# Patient Record
Sex: Female | Born: 1955
Health system: Southern US, Community
[De-identification: ages and names within clinical notes are randomized; demographics above are authoritative.]

## PROBLEM LIST (undated history)

## (undated) DIAGNOSIS — Z972 Presence of dental prosthetic device (complete) (partial): Secondary | ICD-10-CM

## (undated) DIAGNOSIS — K219 Gastro-esophageal reflux disease without esophagitis: Secondary | ICD-10-CM

## (undated) DIAGNOSIS — F419 Anxiety disorder, unspecified: Secondary | ICD-10-CM

## (undated) DIAGNOSIS — J449 Chronic obstructive pulmonary disease, unspecified: Secondary | ICD-10-CM

## (undated) DIAGNOSIS — D509 Iron deficiency anemia, unspecified: Secondary | ICD-10-CM

## (undated) DIAGNOSIS — C44300 Unspecified malignant neoplasm of skin of unspecified part of face: Secondary | ICD-10-CM

## (undated) DIAGNOSIS — E785 Hyperlipidemia, unspecified: Secondary | ICD-10-CM

## (undated) DIAGNOSIS — I10 Essential (primary) hypertension: Secondary | ICD-10-CM

## (undated) DIAGNOSIS — G4700 Insomnia, unspecified: Secondary | ICD-10-CM

## (undated) DIAGNOSIS — M1990 Unspecified osteoarthritis, unspecified site: Secondary | ICD-10-CM

## (undated) DIAGNOSIS — K08109 Complete loss of teeth, unspecified cause, unspecified class: Secondary | ICD-10-CM

## (undated) DIAGNOSIS — F17200 Nicotine dependence, unspecified, uncomplicated: Secondary | ICD-10-CM

## (undated) DIAGNOSIS — D649 Anemia, unspecified: Secondary | ICD-10-CM

## (undated) DIAGNOSIS — L409 Psoriasis, unspecified: Secondary | ICD-10-CM

## (undated) HISTORY — PX: EXPLORATORY LAPAROTOMY: SUR591

## (undated) HISTORY — DX: Anxiety disorder, unspecified: F41.9

## (undated) HISTORY — DX: Gastro-esophageal reflux disease without esophagitis: K21.9

## (undated) HISTORY — PX: APPENDECTOMY: SHX54

## (undated) HISTORY — DX: Insomnia, unspecified: G47.00

## (undated) HISTORY — DX: Unspecified malignant neoplasm of skin of unspecified part of face: C44.300

## (undated) HISTORY — DX: Iron deficiency anemia, unspecified: D50.9

## (undated) HISTORY — DX: Anemia, unspecified: D64.9

## (undated) HISTORY — DX: Hyperlipidemia, unspecified: E78.5

## (undated) HISTORY — DX: Essential (primary) hypertension: I10

## (undated) HISTORY — DX: Nicotine dependence, unspecified, uncomplicated: F17.200

## (undated) HISTORY — DX: Psoriasis, unspecified: L40.9

## (undated) HISTORY — PX: SKIN BIOPSY: SHX1

## (undated) HISTORY — PX: TUBAL LIGATION: SHX77

## (undated) HISTORY — DX: Unspecified osteoarthritis, unspecified site: M19.90

---

## 2005-09-09 ENCOUNTER — Emergency Department (HOSPITAL_COMMUNITY): Admission: EM | Admit: 2005-09-09 | Discharge: 2005-09-09 | Payer: Self-pay | Admitting: Emergency Medicine

## 2006-09-10 ENCOUNTER — Emergency Department (HOSPITAL_COMMUNITY): Admission: EM | Admit: 2006-09-10 | Discharge: 2006-09-10 | Payer: Self-pay | Admitting: Emergency Medicine

## 2013-09-06 ENCOUNTER — Encounter: Payer: Self-pay | Admitting: Family Medicine

## 2013-09-19 ENCOUNTER — Encounter: Payer: Self-pay | Admitting: Family Medicine

## 2013-09-19 ENCOUNTER — Ambulatory Visit (INDEPENDENT_AMBULATORY_CARE_PROVIDER_SITE_OTHER): Payer: BC Managed Care – PPO | Admitting: Family Medicine

## 2013-09-19 VITALS — BP 170/90 | HR 78 | Temp 97.7°F | Resp 16 | Ht 64.0 in | Wt 132.0 lb

## 2013-09-19 DIAGNOSIS — F17200 Nicotine dependence, unspecified, uncomplicated: Secondary | ICD-10-CM | POA: Insufficient documentation

## 2013-09-19 DIAGNOSIS — I10 Essential (primary) hypertension: Secondary | ICD-10-CM

## 2013-09-19 DIAGNOSIS — L4050 Arthropathic psoriasis, unspecified: Secondary | ICD-10-CM

## 2013-09-19 DIAGNOSIS — G4700 Insomnia, unspecified: Secondary | ICD-10-CM

## 2013-09-19 LAB — COMPLETE METABOLIC PANEL WITH GFR
ALK PHOS: 93 U/L (ref 39–117)
ALT: 17 U/L (ref 0–35)
AST: 19 U/L (ref 0–37)
Albumin: 4.3 g/dL (ref 3.5–5.2)
BUN: 7 mg/dL (ref 6–23)
CALCIUM: 9.8 mg/dL (ref 8.4–10.5)
CO2: 29 meq/L (ref 19–32)
Chloride: 101 mEq/L (ref 96–112)
Creat: 0.64 mg/dL (ref 0.50–1.10)
GLUCOSE: 89 mg/dL (ref 70–99)
POTASSIUM: 4.4 meq/L (ref 3.5–5.3)
Sodium: 138 mEq/L (ref 135–145)
TOTAL PROTEIN: 7 g/dL (ref 6.0–8.3)
Total Bilirubin: 0.4 mg/dL (ref 0.2–1.2)

## 2013-09-19 LAB — CBC WITH DIFFERENTIAL/PLATELET
Basophils Absolute: 0.1 10*3/uL (ref 0.0–0.1)
Basophils Relative: 1 % (ref 0–1)
Eosinophils Absolute: 0.1 10*3/uL (ref 0.0–0.7)
Eosinophils Relative: 2 % (ref 0–5)
HEMATOCRIT: 36.9 % (ref 36.0–46.0)
HEMOGLOBIN: 12.7 g/dL (ref 12.0–15.0)
LYMPHS ABS: 2.5 10*3/uL (ref 0.7–4.0)
LYMPHS PCT: 35 % (ref 12–46)
MCH: 29.6 pg (ref 26.0–34.0)
MCHC: 34.4 g/dL (ref 30.0–36.0)
MCV: 86 fL (ref 78.0–100.0)
Monocytes Absolute: 0.5 10*3/uL (ref 0.1–1.0)
Monocytes Relative: 7 % (ref 3–12)
Neutro Abs: 4 10*3/uL (ref 1.7–7.7)
Neutrophils Relative %: 55 % (ref 43–77)
Platelets: 317 10*3/uL (ref 150–400)
RBC: 4.29 MIL/uL (ref 3.87–5.11)
RDW: 14 % (ref 11.5–15.5)
WBC: 7.2 10*3/uL (ref 4.0–10.5)

## 2013-09-19 LAB — LIPID PANEL
CHOL/HDL RATIO: 5.4 ratio
Cholesterol: 199 mg/dL (ref 0–200)
HDL: 37 mg/dL — ABNORMAL LOW (ref >39)
LDL Cholesterol: 131 mg/dL — ABNORMAL HIGH (ref 0–99)
Triglycerides: 155 mg/dL — ABNORMAL HIGH (ref <150)
VLDL: 31 mg/dL (ref 0–40)

## 2013-09-19 MED ORDER — TRAZODONE HCL 50 MG PO TABS: 25.0000 mg | ORAL_TABLET | Freq: Every evening | ORAL | Status: DC | PRN

## 2013-09-19 MED ORDER — MELOXICAM 15 MG PO TABS: 15.0000 mg | ORAL_TABLET | Freq: Every day | ORAL | Status: DC

## 2013-09-19 MED ORDER — LOSARTAN POTASSIUM 50 MG PO TABS: 50.0000 mg | ORAL_TABLET | Freq: Every day | ORAL | Status: DC

## 2013-09-19 NOTE — Progress Notes (Signed)
Subjective:    Patient ID: Lauren Harrison, female    DOB: September 11, 1955, 58 y.o.   MRN: [id number]  HPI Patient is here today to establish care. She has no significant past medical history that she is aware of. However her blood pressure is extremely elevated today at 170/90 she denies any chest pain, shortness of breath, dyspnea on exertion. Unfortunately she does smoke one pack of cigarettes per day. She also complains of daily insomnia. She is unable to fall asleep at night. At that he gets one hour of an interrupted sleep. She states that she awakens  frequently throughout the night and has a difficult time falling back asleep.  Her biggest concern today is pain in both hands and both knees. She reports joint pain in all MCP joints and all PIP joints in both hands. She also complains of pain and stiffness in both knees. This is been going on for crashing her. She's tried naproxen with minimal relief. Note she has a psoriasis-like rash on the dorsums of both hands both elbows. She's had this for years. She's never had a mammogram. She's never had a colonoscopy Past Medical History  Diagnosis Date  . Anemia   . Anxiety   . Arthritis   . Hypertension   . Smoker   . Insomnia    Past Surgical History  Procedure Laterality Date  . Appendectomy     No current outpatient prescriptions on file prior to visit.   No current facility-administered medications on file prior to visit.   No Known Allergies History   Social History  . Marital Status: Married    Spouse Name: N/A    Number of Children: N/A  . Years of Education: N/A   Occupational History  . Not on file.   Social History Main Topics  . Smoking status: Current Every Day Smoker  . Smokeless tobacco: Not on file  . Alcohol Use: Yes     Comment: Occasional  . Drug Use: No  . Sexual Activity: Yes    Birth Control/ Protection: None   Other Topics Concern  . Not on file   Social History Narrative  . No narrative on file       Review of Systems  All other systems reviewed and are negative.      Objective:   Physical Exam  Vitals reviewed. Constitutional: She appears well-developed and well-nourished.  Neck: Neck supple. No JVD present. No thyromegaly present.  Cardiovascular: Normal rate, regular rhythm, normal heart sounds and intact distal pulses.   No murmur heard. Pulmonary/Chest: Effort normal and breath sounds normal. No respiratory distress. She has no wheezes. She has no rales.  Abdominal: Soft. Bowel sounds are normal. She exhibits no distension. There is no tenderness. There is no rebound and no guarding.  Lymphadenopathy:    She has no cervical adenopathy.  Skin: Rash noted. There is erythema.   3 mm black abnormal-appearing mole behind the right ear        Assessment & Plan:  Essential hypertension - Plan: COMPLETE METABOLIC PANEL WITH GFR, Lipid panel, CBC with Differential  Psoriasis with arthropathy - Plan: Ambulatory referral to Rheumatology  Insomnia  Patient's blood pressure significantly elevated. I instructed patient on losartan 50 mg by mouth daily. Recheck blood pressure in one month. When the patient returns in one month I would like to perform a biopsy of the abnormal-appearing mole behind her right ear. Patient agrees to this. She is admitted for colonoscopy and a  mammogram. She elected to discuss this further at her next office visit. I instructed patient on Mobic 15 mg by mouth daily for her arthritis. I am concerned patient may have psoriatic arthritis. Therefore consult rheumatology as I believe the patient may benefit from [facility] injections.  I instructed patient on trazodone 50 mg by mouth each bedtime for insomnia. I encouraged the patient to consider smoking cessation.

## 2013-10-16 ENCOUNTER — Other Ambulatory Visit: Payer: Self-pay | Admitting: Family Medicine

## 2013-10-20 ENCOUNTER — Ambulatory Visit (INDEPENDENT_AMBULATORY_CARE_PROVIDER_SITE_OTHER): Payer: BC Managed Care – PPO | Admitting: Family Medicine

## 2013-10-20 ENCOUNTER — Encounter: Payer: Self-pay | Admitting: Family Medicine

## 2013-10-20 VITALS — BP 144/82 | HR 78 | Temp 97.6°F | Resp 16 | Ht 64.17 in | Wt 136.0 lb

## 2013-10-20 DIAGNOSIS — Z23 Encounter for immunization: Secondary | ICD-10-CM

## 2013-10-20 DIAGNOSIS — D485 Neoplasm of uncertain behavior of skin: Secondary | ICD-10-CM

## 2013-10-20 DIAGNOSIS — Z0000 Encounter for general adult medical examination without abnormal findings: Secondary | ICD-10-CM

## 2013-10-20 DIAGNOSIS — I10 Essential (primary) hypertension: Secondary | ICD-10-CM

## 2013-10-20 MED ORDER — ALPRAZOLAM 0.5 MG PO TABS: 0.5000 mg | ORAL_TABLET | Freq: Every evening | ORAL | Status: DC | PRN

## 2013-10-20 MED ORDER — LOSARTAN POTASSIUM 100 MG PO TABS: 100.0000 mg | ORAL_TABLET | Freq: Every day | ORAL | Status: DC

## 2013-10-20 NOTE — Progress Notes (Signed)
Subjective:    Patient ID: Lauren Harrison, female    DOB: [birth year], 58 y.o.   MRN: [id number]  HPI  Please see the patient's last office visit.  Patient's blood pressure is much better today at 144/82. She has tried to decrease her smoking. Unfortunately trazodone is not helping her sleep. She will try something stronger. In the past he is using a next 1-2 times a week to help with insomnia. She is requesting that I prescribe that medication for her. She is also requesting a flu shot. She is overdue for colonoscopy and a mammogram and is asking to schedule those. I be glad to do so. She also has a black 3-4 mm spot behind her right ear which is concerning for possible atypical viral. She is here today for biopsy of that. Past Medical History  Diagnosis Date  . Anemia   . Anxiety   . Arthritis   . Hypertension   . Smoker   . Insomnia    Past Surgical History  Procedure Laterality Date  . Appendectomy     Current Outpatient Prescriptions on File Prior to Visit  Medication Sig Dispense Refill  . losartan (COZAAR) 50 MG tablet Take 1 tablet (50 mg total) by mouth daily.  30 tablet  3  . meloxicam (MOBIC) 15 MG tablet TAKE 1 TABLET (15 MG TOTAL) BY MOUTH DAILY.  30 tablet  5  . traZODone (DESYREL) 50 MG tablet Take 0.5-1 tablets (25-50 mg total) by mouth at bedtime as needed for sleep.  30 tablet  3  . Naproxen Sodium (ALEVE) 220 MG CAPS Take by mouth.       No current facility-administered medications on file prior to visit.   No Known Allergies History   Social History  . Marital Status: Married    Spouse Name: N/A    Number of Children: N/A  . Years of Education: N/A   Occupational History  . Not on file.   Social History Main Topics  . Smoking status: Current Every Day Smoker  . Smokeless tobacco: Not on file  . Alcohol Use: Yes     Comment: Occasional  . Drug Use: No  . Sexual Activity: Yes    Birth Control/ Protection: None   Other Topics Concern  . Not on file    Social History Narrative  . No narrative on file     Review of Systems  All other systems reviewed and are negative.      Objective:   Physical Exam  Vitals reviewed. Constitutional: She appears well-developed and well-nourished.  Cardiovascular: Normal rate, regular rhythm and normal heart sounds.  Exam reveals no gallop and no friction rub.   No murmur heard. Pulmonary/Chest: Effort normal and breath sounds normal. No respiratory distress. She has no wheezes. She has no rales. She exhibits no tenderness.  Abdominal: Soft. Bowel sounds are normal. She exhibits no distension and no mass. There is no tenderness. There is no rebound and no guarding.          Assessment & Plan:  Neoplasm of uncertain behavior of skin - Plan: Pathology  Essential hypertension - Plan: Flu Vaccine QUAD 36+ mos IM  Need for prophylactic vaccination and inoculation against influenza - Plan: Flu Vaccine QUAD 36+ mos IM  Routine general medical examination at a health care facility - Plan: MM Digital Screening, Ambulatory referral to Gastroenterology   Using sterile technique, I anesthetized the lesion behind her right ear with 0.1% lidocaine with epinephrine.  I then performed a shave biopsy and sent the lesion to pathology and labeled container. Hemostasis was achieved with Drysol and a Band-Aid. Her blood pressure is much improved. I will increase her losartan 100 mg by mouth daily. I again encouraged her to continue her smoking cessation. I will schedule patient for mammogram as well as a colonoscopy. Patient also received her flu shot today.

## 2013-10-23 ENCOUNTER — Other Ambulatory Visit: Payer: Self-pay | Admitting: Family Medicine

## 2013-10-23 DIAGNOSIS — Z1211 Encounter for screening for malignant neoplasm of colon: Secondary | ICD-10-CM

## 2013-10-24 LAB — PATHOLOGY

## 2013-11-08 ENCOUNTER — Ambulatory Visit
Admission: RE | Admit: 2013-11-08 | Discharge: 2013-11-08 | Disposition: A | Discharge status: Home / Self Care | Payer: BC Managed Care – PPO | Source: Ambulatory Visit | Attending: Family Medicine | Admitting: Family Medicine

## 2013-11-08 DIAGNOSIS — Z0000 Encounter for general adult medical examination without abnormal findings: Secondary | ICD-10-CM

## 2013-11-13 ENCOUNTER — Other Ambulatory Visit: Payer: Self-pay | Admitting: Family Medicine

## 2013-11-13 DIAGNOSIS — R928 Other abnormal and inconclusive findings on diagnostic imaging of breast: Secondary | ICD-10-CM

## 2013-11-21 ENCOUNTER — Ambulatory Visit (HOSPITAL_COMMUNITY)
Admission: RE | Admit: 2013-11-21 | Discharge: 2013-11-21 | Disposition: A | Discharge status: Home / Self Care | Payer: BC Managed Care – PPO | Source: Ambulatory Visit | Attending: Family Medicine | Admitting: Family Medicine

## 2013-11-21 ENCOUNTER — Other Ambulatory Visit: Payer: Self-pay | Admitting: Family Medicine

## 2013-11-21 DIAGNOSIS — R928 Other abnormal and inconclusive findings on diagnostic imaging of breast: Secondary | ICD-10-CM

## 2013-11-21 DIAGNOSIS — R921 Mammographic calcification found on diagnostic imaging of breast: Secondary | ICD-10-CM

## 2013-11-27 ENCOUNTER — Ambulatory Visit
Admission: RE | Admit: 2013-11-27 | Discharge: 2013-11-27 | Disposition: A | Discharge status: Home / Self Care | Payer: BC Managed Care – PPO | Source: Ambulatory Visit | Attending: Family Medicine | Admitting: Family Medicine

## 2013-11-27 DIAGNOSIS — R921 Mammographic calcification found on diagnostic imaging of breast: Secondary | ICD-10-CM

## 2013-11-28 ENCOUNTER — Telehealth: Payer: Self-pay | Admitting: Family Medicine

## 2013-11-28 NOTE — Telephone Encounter (Signed)
LMTRC

## 2013-11-28 NOTE — Telephone Encounter (Signed)
lmtrc

## 2013-11-28 NOTE — Telephone Encounter (Signed)
Lea from the breast center calling to give biopsy results and to ask where patient needs to go for surgery  [phone] asap she said

## 2013-12-12 ENCOUNTER — Other Ambulatory Visit: Payer: Self-pay | Admitting: Rheumatology

## 2013-12-12 ENCOUNTER — Ambulatory Visit
Admission: RE | Admit: 2013-12-12 | Discharge: 2013-12-12 | Disposition: A | Discharge status: Home / Self Care | Payer: BC Managed Care – PPO | Source: Ambulatory Visit | Attending: Rheumatology | Admitting: Rheumatology

## 2013-12-12 ENCOUNTER — Other Ambulatory Visit (INDEPENDENT_AMBULATORY_CARE_PROVIDER_SITE_OTHER): Payer: Self-pay | Admitting: General Surgery

## 2013-12-12 DIAGNOSIS — F17200 Nicotine dependence, unspecified, uncomplicated: Secondary | ICD-10-CM

## 2013-12-12 DIAGNOSIS — R928 Other abnormal and inconclusive findings on diagnostic imaging of breast: Secondary | ICD-10-CM

## 2013-12-25 ENCOUNTER — Other Ambulatory Visit: Payer: Self-pay | Admitting: Family Medicine

## 2013-12-25 NOTE — Telephone Encounter (Signed)
ok 

## 2013-12-25 NOTE — Telephone Encounter (Signed)
?   OK to Refill  

## 2013-12-26 NOTE — Telephone Encounter (Signed)
ok 

## 2014-01-01 ENCOUNTER — Encounter (HOSPITAL_BASED_OUTPATIENT_CLINIC_OR_DEPARTMENT_OTHER): Payer: Self-pay | Admitting: *Deleted

## 2014-01-01 NOTE — Progress Notes (Signed)
Will come in for labs and ekg after seeds 12/3

## 2014-01-03 NOTE — H&P (Signed)
Lauren Harrison  Location: [facility] Patient #: [id number] DOB: 06/09/1955 Married / Language: English / Race: White Female       History of Present Illness Patient words: eval lesion left breast.  The patient is a 58 year old female who presents with a complaint of Breast problems. This is a 58 year old female from [place] ,[place]. Her PCP is Dr. Jenna Luo. She is referred by Dr. Nolon Nations at the Breast Ctr[facility] for evaluation and management of left breast calcifications which have been biopsied and showed a complex sclerosing lesion. The patient had an open left breast biopsy in the upper outer quadrant many years ago. She states this was benign calcifications. She recently went for screening mammograms and they found a 2.6 cm x 1.6 cm area of suspicious amorphous calcifications, left breast, upper outer quadrant. Biopsy shows complex sclerosing lesion. Family history is negative for breast or ovarian cancer. Past history is significant for tobacco abuse, hypertension, appendectomy and anxiety and insomnia. Addendum Note([name]; 12/12/2013 5:15 PM) The patient wishes to proceed with left breast lumpectomy with radioactive seed localization. I have discussed the indications, details, techniques, and numerous risk of the surgery with her. She is aware of the risk of bleeding, infection, further surgery if cancer is found, cosmetic deformity, nerve damage with chronic pain or numbness. She understands all of these issues and all of her questions are answered. She agrees with this plan.    Other Problems Anxiety Disorder Back Pain High blood pressure  Past Surgical History  Appendectomy Breast Biopsy Left.  Diagnostic Studies History \ Colonoscopy never Mammogram within last year Pap Smear >5 years ago  Allergies Marjean Donna, CMA; 12/12/2013 1:57 PM) No Known Drug Allergies11/11/2013  Medication History (Sonya Bynum,  CMA; 12/12/2013 1:57 PM) Losartan Potassium (100MG  Tablet, Oral) Active. Losartan Potassium (50MG  Tablet, Oral) Active. Meloxicam (15MG  Tablet, Oral) Active. TraZODone HCl (50MG  Tablet, Oral) Active. ALPRAZolam (0.5MG  Tablet, Oral) Active.  Social History Alcohol use Occasional alcohol use. Caffeine use Carbonated beverages. No drug use Tobacco use Current some day smoker.  Family History Arthritis Family Members In General. Cancer Father. Cerebrovascular Accident Mother. Diabetes Mellitus Mother, Sister. Heart Disease Mother, Sister. Heart disease in female family member before age 75 Hypertension Family Members In General, Mother, Sister.  Pregnancy / Birth History  Age at menarche 61 years. Age of menopause <46 Gravida 1 Maternal age 23-25 Para 1  Review of Systems  General Not Present- Appetite Loss, Chills, Fatigue, Fever, Night Sweats, Weight Gain and Weight Loss. Skin Present- Dryness. Not Present- Change in Wart/Mole, Hives, Jaundice, New Lesions, Non-Healing Wounds, Rash and Ulcer. HEENT Not Present- Earache, Hearing Loss, Hoarseness, Nose Bleed, Oral Ulcers, Ringing in the Ears, Seasonal Allergies, Sinus Pain, Sore Throat, Visual Disturbances, Wears glasses/contact lenses and Yellow Eyes. Respiratory Not Present- Bloody sputum, Chronic Cough, Difficulty Breathing, Snoring and Wheezing. Breast Not Present- Breast Mass, Breast Pain, Nipple Discharge and Skin Changes. Cardiovascular Present- Leg Cramps. Not Present- Chest Pain, Difficulty Breathing Lying Down, Palpitations, Rapid Heart Rate, Shortness of Breath and Swelling of Extremities. Gastrointestinal Not Present- Abdominal Pain, Bloating, Bloody Stool, Change in Bowel Habits, Chronic diarrhea, Constipation, Difficulty Swallowing, Excessive gas, Gets full quickly at meals, Hemorrhoids, Indigestion, Nausea, Rectal Pain and Vomiting. Musculoskeletal Present- Back Pain and Joint Pain. Not Present-  Joint Stiffness, Muscle Pain, Muscle Weakness and Swelling of Extremities. Neurological Not Present- Decreased Memory, Fainting, Headaches, Numbness, Seizures, Tingling, Tremor, Trouble walking and Weakness. Psychiatric Present- Anxiety. Not Present-  Bipolar, Change in Sleep Pattern, Depression, Fearful and Frequent crying. Endocrine Not Present- Cold Intolerance, Excessive Hunger, Hair Changes, Heat Intolerance, Hot flashes and New Diabetes. Hematology Not Present- Easy Bruising, Excessive bleeding, Gland problems, HIV and Persistent Infections.   Vitals 12/12/2013 1:58 PM Weight: 137 lb Height: 64in Body Surface Area: 1.68 m Body Mass Index: 23.52 kg/m Temp.: 68F(Temporal)  Pulse: 78 (Regular)  BP: 126/72 (Sitting, Left Arm, Standard)    Physical Exam General Mental Status-Alert. General Appearance-Consistent with stated age. Hydration-Well hydrated. Voice-Normal.  Head and Neck Head-normocephalic, atraumatic with no lesions or palpable masses. Trachea-midline. Thyroid Gland Characteristics - normal size and consistency.  Eye Eyeball - Bilateral-Extraocular movements intact. Sclera/Conjunctiva - Bilateral-No scleral icterus.  Chest and Lung Exam Chest and lung exam reveals -quiet, even and easy respiratory effort with no use of accessory muscles and on auscultation, normal breath sounds, no adventitious sounds and normal vocal resonance. Inspection Chest Wall - Normal. Back - normal.  Breast Breast - Left-Symmetric and Biopsy scar, Non Tender, No Dimpling, No Inflammation, No Lumpectomy scars, No Mastectomy scars, No Peau d' Orange. Breast - Right-Symmetric, Non Tender, No Biopsy scars, no Dimpling, No Inflammation, No Lumpectomy scars, No Mastectomy scars, No Peau d' Orange. Breast Lump-No Palpable Breast Mass. Note: Left breast reveals well-healed linear scar upper outer quadrant. Recent biopsy site. No real palpable mass in  either breast. Minimal ecchymoses. No axillary masses on either side.   Cardiovascular Cardiovascular examination reveals -normal heart sounds, regular rate and rhythm with no murmurs and normal pedal pulses bilaterally.  Abdomen Inspection Inspection of the abdomen reveals - No Hernias. Palpation/Percussion Palpation and Percussion of the abdomen reveal - Soft, Non Tender, No Rebound tenderness, No Rigidity (guarding) and No hepatosplenomegaly. Auscultation Auscultation of the abdomen reveals - Bowel sounds normal. Note: Appendectomy scar.   Neurologic Neurologic evaluation reveals -alert and oriented x 3 with no impairment of recent or remote memory. Mental Status-Normal.  Musculoskeletal Normal Exam - Left-Upper Extremity Strength Normal and Lower Extremity Strength Normal. Normal Exam - Right-Upper Extremity Strength Normal and Lower Extremity Strength Normal.  Lymphatic Head & Neck  General Head & Neck Lymphatics: Bilateral - Description - Normal. Axillary  General Axillary Region: Bilateral - Description - Normal. Tenderness - Non Tender. Femoral & Inguinal  Generalized Femoral & Inguinal Lymphatics: Bilateral - Description - Normal. Tenderness - Non Tender.    Assessment & Plan  ABNORMAL MAMMOGRAM OF LEFT BREAST (793.80  R92.8) Current Plans  Schedule for Surgery The biopsy of the calcifications in your left breast show a complex sclerosing lesion. There is about a 10% chance that there could be early breast cancer in this area. This area of calcification should be excised, and we will schedule that surgery for you We have discussed the techniques and risks of the surgery. you will be scheduled for left breast lumpectomy with radioactive seed localization in the near future  TOBACCO ABUSE (305.1  Z72.0)  HYPERTENSION, BENIGN (401.1  I10)  ANXIETY (300.00  F41.9)    Kandance Yano M. Dalbert Batman, M.D., [facility] General and  Minimally invasive Surgery Breast and Colorectal Surgery Office:   [phone] Pager:   [phone]

## 2014-01-04 ENCOUNTER — Encounter (HOSPITAL_BASED_OUTPATIENT_CLINIC_OR_DEPARTMENT_OTHER)
Admission: RE | Admit: 2014-01-04 | Discharge: 2014-01-04 | Disposition: A | Discharge status: Home / Self Care | Payer: BC Managed Care – PPO | Source: Ambulatory Visit | Attending: General Surgery | Admitting: General Surgery

## 2014-01-04 ENCOUNTER — Ambulatory Visit
Admission: RE | Admit: 2014-01-04 | Discharge: 2014-01-04 | Disposition: A | Discharge status: Home / Self Care | Payer: BC Managed Care – PPO | Source: Ambulatory Visit | Attending: General Surgery | Admitting: General Surgery

## 2014-01-04 DIAGNOSIS — F419 Anxiety disorder, unspecified: Secondary | ICD-10-CM | POA: Diagnosis not present

## 2014-01-04 DIAGNOSIS — F17210 Nicotine dependence, cigarettes, uncomplicated: Secondary | ICD-10-CM | POA: Diagnosis not present

## 2014-01-04 DIAGNOSIS — R928 Other abnormal and inconclusive findings on diagnostic imaging of breast: Secondary | ICD-10-CM | POA: Diagnosis present

## 2014-01-04 DIAGNOSIS — I10 Essential (primary) hypertension: Secondary | ICD-10-CM | POA: Diagnosis not present

## 2014-01-04 DIAGNOSIS — N6092 Unspecified benign mammary dysplasia of left breast: Secondary | ICD-10-CM | POA: Diagnosis not present

## 2014-01-04 LAB — COMPREHENSIVE METABOLIC PANEL
ALT: 38 U/L — AB (ref 0–35)
AST: 34 U/L (ref 0–37)
Albumin: 3.7 g/dL (ref 3.5–5.2)
Alkaline Phosphatase: 114 U/L (ref 39–117)
Anion gap: 12 (ref 5–15)
BUN: 8 mg/dL (ref 6–23)
CALCIUM: 9.7 mg/dL (ref 8.4–10.5)
CO2: 26 mEq/L (ref 19–32)
CREATININE: 0.68 mg/dL (ref 0.50–1.10)
Chloride: 101 mEq/L (ref 96–112)
GFR calc Af Amer: >90 mL/min (ref >90)
GFR calc non Af Amer: >90 mL/min (ref >90)
Glucose, Bld: 96 mg/dL (ref 70–99)
Potassium: 5 mEq/L (ref 3.7–5.3)
SODIUM: 139 meq/L (ref 137–147)
TOTAL PROTEIN: 7 g/dL (ref 6.0–8.3)
Total Bilirubin: 0.3 mg/dL (ref 0.3–1.2)

## 2014-01-04 LAB — CBC WITH DIFFERENTIAL/PLATELET
BASOS PCT: 1 % (ref 0–1)
Basophils Absolute: 0 10*3/uL (ref 0.0–0.1)
EOS ABS: 0.1 10*3/uL (ref 0.0–0.7)
Eosinophils Relative: 2 % (ref 0–5)
HCT: 36.5 % (ref 36.0–46.0)
Hemoglobin: 11.7 g/dL — ABNORMAL LOW (ref 12.0–15.0)
Lymphocytes Relative: 34 % (ref 12–46)
Lymphs Abs: 1.9 10*3/uL (ref 0.7–4.0)
MCH: 28.8 pg (ref 26.0–34.0)
MCHC: 32.1 g/dL (ref 30.0–36.0)
MCV: 89.9 fL (ref 78.0–100.0)
Monocytes Absolute: 0.4 10*3/uL (ref 0.1–1.0)
Monocytes Relative: 7 % (ref 3–12)
Neutro Abs: 3.1 10*3/uL (ref 1.7–7.7)
Neutrophils Relative %: 56 % (ref 43–77)
PLATELETS: 299 10*3/uL (ref 150–400)
RBC: 4.06 MIL/uL (ref 3.87–5.11)
RDW: 13.3 % (ref 11.5–15.5)
WBC: 5.4 10*3/uL (ref 4.0–10.5)

## 2014-01-05 ENCOUNTER — Ambulatory Visit (HOSPITAL_BASED_OUTPATIENT_CLINIC_OR_DEPARTMENT_OTHER): Payer: BC Managed Care – PPO | Admitting: Anesthesiology

## 2014-01-05 ENCOUNTER — Ambulatory Visit
Admission: RE | Admit: 2014-01-05 | Discharge: 2014-01-05 | Disposition: A | Discharge status: Home / Self Care | Payer: BC Managed Care – PPO | Source: Ambulatory Visit | Attending: General Surgery | Admitting: General Surgery

## 2014-01-05 ENCOUNTER — Encounter (HOSPITAL_BASED_OUTPATIENT_CLINIC_OR_DEPARTMENT_OTHER): Payer: Self-pay | Admitting: *Deleted

## 2014-01-05 ENCOUNTER — Encounter (HOSPITAL_BASED_OUTPATIENT_CLINIC_OR_DEPARTMENT_OTHER)
Admission: RE | Disposition: A | Discharge status: Home / Self Care | Payer: Self-pay | Source: Ambulatory Visit | Attending: General Surgery

## 2014-01-05 ENCOUNTER — Ambulatory Visit (HOSPITAL_BASED_OUTPATIENT_CLINIC_OR_DEPARTMENT_OTHER)
Admission: RE | Admit: 2014-01-05 | Discharge: 2014-01-05 | Disposition: A | Discharge status: Home / Self Care | Payer: BC Managed Care – PPO | Source: Ambulatory Visit | Attending: General Surgery | Admitting: General Surgery

## 2014-01-05 DIAGNOSIS — R928 Other abnormal and inconclusive findings on diagnostic imaging of breast: Secondary | ICD-10-CM

## 2014-01-05 DIAGNOSIS — F17210 Nicotine dependence, cigarettes, uncomplicated: Secondary | ICD-10-CM | POA: Insufficient documentation

## 2014-01-05 DIAGNOSIS — N6092 Unspecified benign mammary dysplasia of left breast: Secondary | ICD-10-CM | POA: Insufficient documentation

## 2014-01-05 DIAGNOSIS — F419 Anxiety disorder, unspecified: Secondary | ICD-10-CM | POA: Insufficient documentation

## 2014-01-05 DIAGNOSIS — I10 Essential (primary) hypertension: Secondary | ICD-10-CM | POA: Insufficient documentation

## 2014-01-05 HISTORY — DX: Complete loss of teeth, unspecified cause, unspecified class: K08.109

## 2014-01-05 HISTORY — DX: Presence of dental prosthetic device (complete) (partial): K08.109

## 2014-01-05 HISTORY — PX: BREAST LUMPECTOMY WITH RADIOACTIVE SEED LOCALIZATION: SHX6424

## 2014-01-05 HISTORY — DX: Presence of dental prosthetic device (complete) (partial): Z97.2

## 2014-01-05 SURGERY — BREAST LUMPECTOMY WITH RADIOACTIVE SEED LOCALIZATION
Anesthesia: General | Site: Breast | Laterality: Left

## 2014-01-05 MED ORDER — BUPIVACAINE-EPINEPHRINE (PF) 0.5% -1:200000 IJ SOLN
INTRAMUSCULAR | Status: AC
  Filled 2014-01-05: qty 30

## 2014-01-05 MED ORDER — MIDAZOLAM HCL 2 MG/2ML IJ SOLN: 1.0000 mg | INTRAMUSCULAR | Status: DC | PRN

## 2014-01-05 MED ORDER — MIDAZOLAM HCL 2 MG/2ML IJ SOLN
INTRAMUSCULAR | Status: AC
  Filled 2014-01-05: qty 2

## 2014-01-05 MED ORDER — LIDOCAINE HCL (CARDIAC) 20 MG/ML IV SOLN
INTRAVENOUS | Status: DC | PRN
  Administered 2014-01-05: 80 mg via INTRAVENOUS

## 2014-01-05 MED ORDER — OXYCODONE HCL 5 MG PO TABS
ORAL_TABLET | ORAL | Status: AC
  Filled 2014-01-05: qty 1

## 2014-01-05 MED ORDER — OXYCODONE HCL 5 MG/5ML PO SOLN: 5.0000 mg | Freq: Once | ORAL | Status: AC | PRN

## 2014-01-05 MED ORDER — DEXAMETHASONE SODIUM PHOSPHATE 4 MG/ML IJ SOLN
INTRAMUSCULAR | Status: DC | PRN
  Administered 2014-01-05: 10 mg via INTRAVENOUS

## 2014-01-05 MED ORDER — PROPOFOL 10 MG/ML IV BOLUS
INTRAVENOUS | Status: DC | PRN
  Administered 2014-01-05: 160 mg via INTRAVENOUS

## 2014-01-05 MED ORDER — BUPIVACAINE-EPINEPHRINE (PF) 0.5% -1:200000 IJ SOLN
INTRAMUSCULAR | Status: DC | PRN
  Administered 2014-01-05: 10 mL

## 2014-01-05 MED ORDER — LACTATED RINGERS IV SOLN
INTRAVENOUS | Status: DC
  Administered 2014-01-05: 07:00:00 via INTRAVENOUS

## 2014-01-05 MED ORDER — CEFAZOLIN SODIUM-DEXTROSE 2-3 GM-% IV SOLR
2.0000 g | INTRAVENOUS | Status: AC
  Administered 2014-01-05: 2 g via INTRAVENOUS

## 2014-01-05 MED ORDER — ONDANSETRON HCL 4 MG/2ML IJ SOLN
INTRAMUSCULAR | Status: DC | PRN
  Administered 2014-01-05: 4 mg via INTRAVENOUS

## 2014-01-05 MED ORDER — HYDROMORPHONE HCL 1 MG/ML IJ SOLN: 0.2500 mg | INTRAMUSCULAR | Status: DC | PRN

## 2014-01-05 MED ORDER — FENTANYL CITRATE 0.05 MG/ML IJ SOLN
INTRAMUSCULAR | Status: AC
  Filled 2014-01-05: qty 6

## 2014-01-05 MED ORDER — FENTANYL CITRATE 0.05 MG/ML IJ SOLN
INTRAMUSCULAR | Status: DC | PRN
  Administered 2014-01-05: 100 ug via INTRAVENOUS

## 2014-01-05 MED ORDER — OXYCODONE HCL 5 MG PO TABS
5.0000 mg | ORAL_TABLET | Freq: Once | ORAL | Status: AC | PRN
  Administered 2014-01-05: 5 mg via ORAL

## 2014-01-05 MED ORDER — FENTANYL CITRATE 0.05 MG/ML IJ SOLN: 50.0000 ug | INTRAMUSCULAR | Status: DC | PRN

## 2014-01-05 MED ORDER — CHLORHEXIDINE GLUCONATE 4 % EX LIQD: 1.0000 "application " | Freq: Once | CUTANEOUS | Status: DC

## 2014-01-05 MED ORDER — EPHEDRINE SULFATE 50 MG/ML IJ SOLN
INTRAMUSCULAR | Status: DC | PRN
  Administered 2014-01-05: 10 mg via INTRAVENOUS

## 2014-01-05 MED ORDER — ONDANSETRON HCL 4 MG/2ML IJ SOLN: 4.0000 mg | Freq: Once | INTRAMUSCULAR | Status: DC | PRN

## 2014-01-05 MED ORDER — PROPOFOL 10 MG/ML IV BOLUS
INTRAVENOUS | Status: AC
  Filled 2014-01-05: qty 20

## 2014-01-05 MED ORDER — MIDAZOLAM HCL 5 MG/5ML IJ SOLN
INTRAMUSCULAR | Status: DC | PRN
  Administered 2014-01-05: 2 mg via INTRAVENOUS

## 2014-01-05 MED ORDER — CEFAZOLIN SODIUM-DEXTROSE 2-3 GM-% IV SOLR
INTRAVENOUS | Status: AC
  Filled 2014-01-05: qty 50

## 2014-01-05 MED ORDER — HYDROCODONE-ACETAMINOPHEN 5-325 MG PO TABS: 1.0000 | ORAL_TABLET | Freq: Four times a day (QID) | ORAL | Status: DC | PRN

## 2014-01-05 SURGICAL SUPPLY — 63 items
APL SKNCLS STERI-STRIP NONHPOA (GAUZE/BANDAGES/DRESSINGS)
APPLIER CLIP 9.375 MED OPEN (MISCELLANEOUS)
APR CLP MED 9.3 20 MLT OPN (MISCELLANEOUS)
BENZOIN TINCTURE PRP APPL 2/3 (GAUZE/BANDAGES/DRESSINGS) IMPLANT
BINDER BREAST LRG (GAUZE/BANDAGES/DRESSINGS) ×1 IMPLANT
BINDER BREAST MEDIUM (GAUZE/BANDAGES/DRESSINGS) IMPLANT
BINDER BREAST XLRG (GAUZE/BANDAGES/DRESSINGS) IMPLANT
BINDER BREAST XXLRG (GAUZE/BANDAGES/DRESSINGS) IMPLANT
BLADE HEX COATED 2.75 (ELECTRODE) ×2 IMPLANT
BLADE SURG 10 STRL SS (BLADE) IMPLANT
BLADE SURG 15 STRL LF DISP TIS (BLADE) ×1 IMPLANT
BLADE SURG 15 STRL SS (BLADE) ×2
CANISTER SUC SOCK COL 7IN (MISCELLANEOUS) ×1 IMPLANT
CANISTER SUCT 1200ML W/VALVE (MISCELLANEOUS) ×2 IMPLANT
CHLORAPREP W/TINT 26ML (MISCELLANEOUS) ×2 IMPLANT
CLIP APPLIE 9.375 MED OPEN (MISCELLANEOUS) IMPLANT
COVER BACK TABLE 60X90IN (DRAPES) ×2 IMPLANT
COVER MAYO STAND STRL (DRAPES) ×2 IMPLANT
COVER PROBE W GEL 5X96 (DRAPES) ×3 IMPLANT
DECANTER SPIKE VIAL GLASS SM (MISCELLANEOUS) IMPLANT
DEVICE DUBIN W/COMP PLATE 8390 (MISCELLANEOUS) ×2 IMPLANT
DRAIN CHANNEL 19F RND (DRAIN) IMPLANT
DRAPE LAPAROSCOPIC ABDOMINAL (DRAPES) ×2 IMPLANT
DRAPE UTILITY XL STRL (DRAPES) ×2 IMPLANT
DRSG PAD ABDOMINAL 8X10 ST (GAUZE/BANDAGES/DRESSINGS) IMPLANT
ELECT REM PT RETURN 9FT ADLT (ELECTROSURGICAL) ×2
ELECTRODE REM PT RTRN 9FT ADLT (ELECTROSURGICAL) ×1 IMPLANT
EVACUATOR SILICONE 100CC (DRAIN) IMPLANT
GLOVE BIOGEL PI IND STRL 6.5 (GLOVE) IMPLANT
GLOVE BIOGEL PI INDICATOR 6.5 (GLOVE) ×1
GLOVE ECLIPSE 6.5 STRL STRAW (GLOVE) ×1 IMPLANT
GLOVE EUDERMIC 7 POWDERFREE (GLOVE) ×2 IMPLANT
GOWN STRL REUS W/ TWL LRG LVL3 (GOWN DISPOSABLE) ×1 IMPLANT
GOWN STRL REUS W/ TWL XL LVL3 (GOWN DISPOSABLE) ×1 IMPLANT
GOWN STRL REUS W/TWL LRG LVL3 (GOWN DISPOSABLE) ×2
GOWN STRL REUS W/TWL XL LVL3 (GOWN DISPOSABLE) ×2
KIT MARKER MARGIN INK (KITS) IMPLANT
LIQUID BAND (GAUZE/BANDAGES/DRESSINGS) ×2 IMPLANT
NDL HYPO 25X1 1.5 SAFETY (NEEDLE) IMPLANT
NEEDLE HYPO 25X1 1.5 SAFETY (NEEDLE) ×2 IMPLANT
NS IRRIG 1000ML POUR BTL (IV SOLUTION) ×2 IMPLANT
PACK BASIN DAY SURGERY FS (CUSTOM PROCEDURE TRAY) ×2 IMPLANT
PENCIL BUTTON HOLSTER BLD 10FT (ELECTRODE) ×2 IMPLANT
PIN SAFETY STERILE (MISCELLANEOUS) ×2 IMPLANT
SHEET MEDIUM DRAPE 40X70 STRL (DRAPES) IMPLANT
SLEEVE SCD COMPRESS KNEE MED (MISCELLANEOUS) ×2 IMPLANT
SPONGE GAUZE 4X4 12PLY STER LF (GAUZE/BANDAGES/DRESSINGS) IMPLANT
SPONGE LAP 18X18 X RAY DECT (DISPOSABLE) IMPLANT
SPONGE LAP 4X18 X RAY DECT (DISPOSABLE) ×2 IMPLANT
STRIP CLOSURE SKIN 1/2X4 (GAUZE/BANDAGES/DRESSINGS) IMPLANT
SUT ETHILON 3 0 FSL (SUTURE) IMPLANT
SUT MNCRL AB 4-0 PS2 18 (SUTURE) ×2 IMPLANT
SUT SILK 2 0 SH (SUTURE) ×2 IMPLANT
SUT VIC AB 2-0 CT1 27 (SUTURE)
SUT VIC AB 2-0 CT1 TAPERPNT 27 (SUTURE) IMPLANT
SUT VIC AB 3-0 SH 27 (SUTURE)
SUT VIC AB 3-0 SH 27X BRD (SUTURE) IMPLANT
SUT VICRYL 3-0 CR8 SH (SUTURE) ×2 IMPLANT
SYRINGE 10CC LL (SYRINGE) ×1 IMPLANT
TOWEL OR 17X24 6PK STRL BLUE (TOWEL DISPOSABLE) ×2 IMPLANT
TOWEL OR NON WOVEN STRL DISP B (DISPOSABLE) ×1 IMPLANT
TUBE CONNECTING 20X1/4 (TUBING) ×2 IMPLANT
YANKAUER SUCT BULB TIP NO VENT (SUCTIONS) ×2 IMPLANT

## 2014-01-05 NOTE — Transfer of Care (Signed)
Immediate Anesthesia Transfer of Care Note  Patient: Lauren Harrison  Procedure(s) Performed: Procedure(s): LEFT BREAST LUMPECTOMY WITH RADIOACTIVE SEED LOCALIZATION (Left)  Patient Location: PACU  Anesthesia Type:General  Level of Consciousness: awake, sedated and patient cooperative  Airway & Oxygen Therapy: Patient Spontanous Breathing and Patient connected to face mask oxygen  Post-op Assessment: Report given to PACU RN and Post -op Vital signs reviewed and stable  Post vital signs: Reviewed and stable  Complications: No apparent anesthesia complications

## 2014-01-05 NOTE — Anesthesia Postprocedure Evaluation (Signed)
  Anesthesia Post-op Note  Patient: Lauren Harrison  Procedure(s) Performed: Procedure(s): LEFT BREAST LUMPECTOMY WITH RADIOACTIVE SEED LOCALIZATION (Left)  Patient Location: PACU  Anesthesia Type:General  Level of Consciousness: awake, alert  and oriented  Airway and Oxygen Therapy: Patient Spontanous Breathing and Patient connected to nasal cannula oxygen  Post-op Pain: mild  Post-op Assessment: Post-op Vital signs reviewed, Patient's Cardiovascular Status Stable, Respiratory Function Stable, Patent Airway, No signs of Nausea or vomiting and Pain level controlled  Post-op Vital Signs: stable  Last Vitals:  Filed Vitals:   01/05/14 0639  BP: 155/71  Pulse: 68  Temp: 36.5 C  Resp: 18    Complications: No apparent anesthesia complications

## 2014-01-05 NOTE — Op Note (Signed)
Patient Name:           Lauren Harrison   Date of Surgery:        01/05/2014  Pre op Diagnosis:      Abnormal mammogram left breast   Post op Diagnosis:    Abnormal mammogram left breast  Procedure:                 Left breast lumpectomy with radioactive seed localization and margin assessment  Surgeon:                     Edsel Petrin. Dalbert Batman, M.D., FACS  Assistant:                      OR staff  Operative Indications:    This is a 58 year old female from [place] ,[place]. Her PCP is Dr. Jenna Luo. She is referred by Dr. Nolon Nations at the Breast [facility]tr[facility] for evaluation and management of left breast calcifications which have been biopsied and showed a complex sclerosing lesion. The patient had an open left breast biopsy in the upper outer quadrant many years ago. She states this was benign calcifications. She recently went for screening mammograms and they found a 2.6 cm x 1.6 cm area of suspicious amorphous calcifications, left breast, upper outer quadrant. Biopsy shows complex sclerosing lesion. Family history is negative for breast or ovarian cancer. Past history is significant for tobacco abuse, hypertension, appendectomy and anxiety and insomnia. She is brought to the operating room electively. Marland Kitchen  Operative Findings:       Radioactive seed was placed by the radiologist yesterday. In the holding area I could identify the radioactivity in the left breast, upper outer quadrant. In the operating room the lumpectomy specimen showed a single marker clip and a single radioactive seed. The radioactivity was within the lumpectomy specimen. There was no residual radioactivity within the lumpectomy cavity. There was no palpable abnormality.  Procedure in Detail:          Following the induction of general LMA anesthesia the patient's left breast was prepped and draped in a sterile fashion, surgical timeout was performed, intravenous antibiotics were given, and 0.5 Marcaine with  epinephrine was used as a local infiltration anesthetic. Using the neoprobe I identified the area of maximum radioactivity in the upper outer quadrant and marked a curvilinear circumareolar incision in this location. Incision was made. Dissection was carried down into the breast tissue. Using the neoprobe frequently I dissected around the radioactivity. The specimen was removed and marked with silk sutures in 3 cardinal positions to orient the pathologist. Specimen mammogram looked good as described above. The specimen was marked and sent to the lab. Hemostasis was excellent and achieved with electrocautery. The wound was irrigated with saline. The breast tissues were closed with interrupted 3-0 Vicryl sutures and the skin closed with a running 4-0 Monocryl subcuticular suture and Dermabond. The patient tolerated the procedure well was taken to PACU in stable condition. EBL 10 mL. Counts correct. Complications none.     Edsel Petrin. Dalbert Batman, M.D., FACS General and Minimally Invasive Surgery Breast and Colorectal Surgery  01/05/2014 8:15 AM

## 2014-01-05 NOTE — Anesthesia Preprocedure Evaluation (Signed)
Anesthesia Evaluation  Patient identified by MRN, date of birth, ID band Patient awake    Reviewed: Allergy & Precautions, H&P , NPO status , Patient's Chart, lab work & pertinent test results  Airway Mallampati: II  TM Distance: >3 FB Neck ROM: Full    Dental  (+) Edentulous Upper, Edentulous Lower   Pulmonary Current Smoker,  breath sounds clear to auscultation        Cardiovascular hypertension, Rhythm:Regular Rate:Normal     Neuro/Psych    GI/Hepatic   Endo/Other    Renal/GU      Musculoskeletal   Abdominal   Peds  Hematology   Anesthesia Other Findings   Reproductive/Obstetrics                             Anesthesia Physical Anesthesia Plan  ASA: II  Anesthesia Plan: General   Post-op Pain Management:    Induction: Intravenous  Airway Management Planned: LMA  Additional Equipment:   Intra-op Plan:   Post-operative Plan:   Informed Consent: I have reviewed the patients History and Physical, chart, labs and discussed the procedure including the risks, benefits and alternatives for the proposed anesthesia with the patient or authorized representative who has indicated his/her understanding and acceptance.     Plan Discussed with: CRNA and Anesthesiologist  Anesthesia Plan Comments:         Anesthesia Quick Evaluation

## 2014-01-05 NOTE — Discharge Instructions (Signed)
[facility] °Office Phone Number [phone] ° °BREAST BIOPSY/ PARTIAL MASTECTOMY: POST OP INSTRUCTIONS ° °Always review your discharge instruction sheet given to you by the facility where your surgery was performed. ° °IF YOU HAVE DISABILITY OR FAMILY LEAVE FORMS, YOU MUST BRING THEM TO THE OFFICE FOR PROCESSING.  DO NOT GIVE THEM TO YOUR DOCTOR. ° °1. A prescription for pain medication may be given to you upon discharge.  Take your pain medication as prescribed, if needed.  If narcotic pain medicine is not needed, then you may take acetaminophen (Tylenol) or ibuprofen (Advil) as needed. °2. Take your usually prescribed medications unless otherwise directed °3. If you need a refill on your pain medication, please contact your pharmacy.  They will contact our office to request authorization.  Prescriptions will not be filled after 5pm or on week-ends. °4. You should eat very light the first 24 hours after surgery, such as soup, crackers, pudding, etc.  Resume your normal diet the day after surgery. °5. Most patients will experience some swelling and bruising in the breast.  Ice packs and a good support bra will help.  Swelling and bruising can take several days to resolve.  °6. It is common to experience some constipation if taking pain medication after surgery.  Increasing fluid intake and taking a stool softener will usually help or prevent this problem from occurring.  A mild laxative (Milk of Magnesia or Miralax) should be taken according to package directions if there are no bowel movements after 48 hours. °7. Unless discharge instructions indicate otherwise, you may remove your bandages 24-48 hours after surgery, and you may shower at that time.  You may have steri-strips (small skin tapes) in place directly over the incision.  These strips should be left on the skin for 7-10 days.  If your surgeon used skin glue on the incision, you may shower in 24 hours.  The glue will flake off over the  next 2-3 weeks.  Any sutures or staples will be removed at the office during your follow-up visit. °8. ACTIVITIES:  You may resume regular daily activities (gradually increasing) beginning the next day.  Wearing a good support bra or sports bra minimizes pain and swelling.  You may have sexual intercourse when it is comfortable. °a. You may drive when you no longer are taking prescription pain medication, you can comfortably wear a seatbelt, and you can safely maneuver your car and apply brakes. °b. RETURN TO WORK:  ______________________________________________________________________________________ °9. You should see your doctor in the office for a follow-up appointment approximately two weeks after your surgery.  Your doctor’s nurse will typically make your follow-up appointment when she calls you with your pathology report.  Expect your pathology report 2-3 business days after your surgery.  You may call to check if you do not hear from us after three days. °10. OTHER INSTRUCTIONS: _______________________________________________________________________________________________ _____________________________________________________________________________________________________________________________________ °_____________________________________________________________________________________________________________________________________ °_____________________________________________________________________________________________________________________________________ ° °WHEN TO CALL YOUR DOCTOR: °1. Fever over 101.0 °2. Nausea and/or vomiting. °3. Extreme swelling or bruising. °4. Continued bleeding from incision. °5. Increased pain, redness, or drainage from the incision. ° °The clinic staff is available to answer your questions during regular business hours.  Please don’t hesitate to call and ask to speak to one of the nurses for clinical concerns.  If you have a medical emergency, go to the nearest  emergency room or call 911.  A surgeon from [facility] is always on call at the hospital. ° °For further questions, please visit [email or web address]  ° ° °  Post Anesthesia Home Care Instructions ° °Activity: °Get plenty of rest for the remainder of the day. A responsible adult should stay with you for 24 hours following the procedure.  °For the next 24 hours, DO NOT: °-Drive a car °-Operate machinery °-Drink alcoholic beverages °-Take any medication unless instructed by your physician °-Make any legal decisions or sign important papers. ° °Meals: °Start with liquid foods such as gelatin or soup. Progress to regular foods as tolerated. Avoid greasy, spicy, heavy foods. If nausea and/or vomiting occur, drink only clear liquids until the nausea and/or vomiting subsides. Call your physician if vomiting continues. ° °Special Instructions/Symptoms: °Your throat may feel dry or sore from the anesthesia or the breathing tube placed in your throat during surgery. If this causes discomfort, gargle with warm salt water. The discomfort should disappear within 24 hours. ° °

## 2014-01-05 NOTE — Interval H&P Note (Signed)
History and Physical Interval Note:  01/05/2014 7:05 AM  Lauren Harrison  has presented today for surgery, with the diagnosis of left breast mass  The goals and the  various methods of treatment have been discussed with the patient and family. After consideration of multiple risks, benefits and other options for treatment, the patient has consented to  Procedure(s): LEFT BREAST LUMPECTOMY WITH RADIOACTIVE SEED LOCALIZATION (Left) as a surgical intervention .  The patient's history has been reviewed, patient examined today, no change in status, stable for surgery.  I have reviewed the patient's chart and labs.  All questions were answered to the patient's satisfaction.  She has good understanding of all issues.   Adin Hector

## 2014-01-05 NOTE — Anesthesia Procedure Notes (Signed)
Procedure Name: LMA Insertion Date/Time: 01/05/2014 7:30 AM Performed by: Lyndee Leo Pre-anesthesia Checklist: Patient identified, Emergency Drugs available, Suction available and Patient being monitored Patient Re-evaluated:Patient Re-evaluated prior to inductionOxygen Delivery Method: Circle System Utilized Preoxygenation: Pre-oxygenation with 100% oxygen Intubation Type: IV induction Ventilation: Mask ventilation without difficulty LMA: LMA inserted LMA Size: 4.0 Number of attempts: 1 Airway Equipment and Method: bite block Placement Confirmation: positive ETCO2 Tube secured with: Tape Dental Injury: Teeth and Oropharynx as per pre-operative assessment

## 2014-01-08 ENCOUNTER — Encounter (HOSPITAL_BASED_OUTPATIENT_CLINIC_OR_DEPARTMENT_OTHER): Payer: Self-pay | Admitting: General Surgery

## 2014-01-08 NOTE — Progress Notes (Signed)
Quick Note:  Inform patient of Pathology report,.breast biopsy shows complex sclerosing lesion and hyperplasia but no cancer. This is good news. I will discuss with her in detail at next office visit.  hmi ______

## 2014-01-09 NOTE — Progress Notes (Signed)
Spoke with patient to inform of path results. HMI will discuss with patient in detail at her follow up appt on 12/17 at 930am. Patient verbalized understanding.

## 2014-01-22 ENCOUNTER — Ambulatory Visit (INDEPENDENT_AMBULATORY_CARE_PROVIDER_SITE_OTHER): Payer: BC Managed Care – PPO | Admitting: Family Medicine

## 2014-01-22 ENCOUNTER — Encounter: Payer: Self-pay | Admitting: Family Medicine

## 2014-01-22 ENCOUNTER — Emergency Department (HOSPITAL_COMMUNITY)
Admission: EM | Admit: 2014-01-22 | Discharge: 2014-01-22 | Disposition: A | Discharge status: Home / Self Care | Payer: BC Managed Care – PPO

## 2014-01-22 VITALS — BP 150/100 | HR 98 | Temp 98.7°F | Resp 24 | Wt 137.0 lb

## 2014-01-22 DIAGNOSIS — R509 Fever, unspecified: Secondary | ICD-10-CM

## 2014-01-22 DIAGNOSIS — J449 Chronic obstructive pulmonary disease, unspecified: Secondary | ICD-10-CM | POA: Diagnosis not present

## 2014-01-22 LAB — INFLUENZA A AND B
INFLUENZA B AG: NEGATIVE
Inflenza A Ag: NEGATIVE

## 2014-01-22 MED ORDER — PREDNISONE 20 MG PO TABS: ORAL_TABLET | ORAL | Status: DC

## 2014-01-22 MED ORDER — ALBUTEROL SULFATE (2.5 MG/3ML) 0.083% IN NEBU
2.5000 mg | INHALATION_SOLUTION | Freq: Once | RESPIRATORY_TRACT | Status: AC
  Administered 2014-01-22: 2.5 mg via RESPIRATORY_TRACT

## 2014-01-22 MED ORDER — ALBUTEROL SULFATE HFA 108 (90 BASE) MCG/ACT IN AERS: 2.0000 | INHALATION_SPRAY | Freq: Four times a day (QID) | RESPIRATORY_TRACT | Status: DC | PRN

## 2014-01-22 MED ORDER — CEFTRIAXONE SODIUM 1 G IJ SOLR
1.0000 g | Freq: Once | INTRAMUSCULAR | Status: AC
  Administered 2014-01-22: 1 g via INTRAMUSCULAR

## 2014-01-22 MED ORDER — LEVOFLOXACIN 750 MG PO TABS: 750.0000 mg | ORAL_TABLET | Freq: Every day | ORAL | Status: DC

## 2014-01-22 NOTE — Progress Notes (Signed)
Subjective:    Patient ID: Lauren Harrison, female    DOB: 14-Jul-1955, 58 y.o.   MRN: [id number]  HPI Patient appears very ill. Her symptoms began Wednesday. She reports fever, chest congestion, cough productive of yellow and brown sputum, shortness of breath, sore throat, nausea and vomiting. She is also having posttussive emesis. On examination today she has rhonchorous breath sounds bilaterally in all 4 quadrants. She also has diffuse expiratory wheezing, increased work of breathing with accessory muscle use. Does not have a history of COPD but is a smoker. Her fever has subsided. She denies any hemoptysis. She does have some pleurisy in the center of her chest. Past Medical History  Diagnosis Date  . Anemia   . Anxiety   . Arthritis   . Hypertension   . Smoker   . Insomnia   . Full dentures    Past Surgical History  Procedure Laterality Date  . Appendectomy    . Tubal ligation    . Back surgery  1995    br bx lt  . Breast lumpectomy with radioactive seed localization Left 01/05/2014    Procedure: LEFT BREAST LUMPECTOMY WITH RADIOACTIVE SEED LOCALIZATION;  Surgeon: Fanny Skates, MD;  Location: [place];  Service: General;  Laterality: Left;   Current Outpatient Prescriptions on File Prior to Visit  Medication Sig Dispense Refill  . ALPRAZolam (XANAX) 0.5 MG tablet TAKE 1 TABLET BY MOUTH AT BEDTIME AS NEEDED FOR SLEEP 30 tablet 2  . HYDROcodone-acetaminophen (NORCO) 5-325 MG per tablet Take 1-2 tablets by mouth every 6 (six) hours as needed for moderate pain or severe pain. 30 tablet 0  . losartan (COZAAR) 100 MG tablet Take 1 tablet (100 mg total) by mouth daily. 90 tablet 3   No current facility-administered medications on file prior to visit.   No Known Allergies History   Social History  . Marital Status: Married    Spouse Name: N/A    Number of Children: N/A  . Years of Education: N/A   Occupational History  . Not on file.   Social History Main  Topics  . Smoking status: Current Every Day Smoker -- 1.00 packs/day  . Smokeless tobacco: Not on file  . Alcohol Use: Yes     Comment: Occasional  . Drug Use: No  . Sexual Activity: Yes    Birth Control/ Protection: None   Other Topics Concern  . Not on file   Social History Narrative      Review of Systems  All other systems reviewed and are negative.      Objective:   Physical Exam  Constitutional: She appears well-developed and well-nourished. She appears distressed.  HENT:  Right Ear: External ear normal.  Left Ear: External ear normal.  Nose: Nose normal.  Mouth/Throat: Oropharynx is clear and moist. No oropharyngeal exudate.  Eyes: Conjunctivae are normal.  Neck: Neck supple. No JVD present.  Cardiovascular: Normal rate, regular rhythm and normal heart sounds.   No murmur heard. Pulmonary/Chest: Accessory muscle usage present. She has wheezes. She has rales.  Abdominal: Soft. Bowel sounds are normal.  Lymphadenopathy:    She has no cervical adenopathy.  Vitals reviewed.         Assessment & Plan:  Fever chills - Plan: Influenza a and b  Bronchitis with airway obstruction - Plan: levofloxacin (LEVAQUIN) 750 MG tablet, predniSONE (DELTASONE) 20 MG tablet, albuterol (PROVENTIL HFA;VENTOLIN HFA) 108 (90 BASE) MCG/ACT inhaler  Patient has bronchitis. She is also  having acute airway obstruction.  I'll start the patient on a prednisone taper pack. I gave her a 2.5 mg albuterol nebulizer treatment today in office. Her symptoms improved dramatically. She can continue to use albuterol 2 puffs inhaled every 4-6 hours as an outpatient. I will start the patient on Levaquin 750 mg by mouth daily to treat bronchitis as well as possibly walking pneumonia. Recheck the patient next week or immediately if worse. Also counseled the patient against smoking.  When the patient is well, I wouldn't have the patient perform pulmonary function test to determine if she does have COPD.  This is my suspicion.

## 2014-01-29 ENCOUNTER — Ambulatory Visit (INDEPENDENT_AMBULATORY_CARE_PROVIDER_SITE_OTHER): Payer: BC Managed Care – PPO | Admitting: Family Medicine

## 2014-01-29 ENCOUNTER — Encounter: Payer: Self-pay | Admitting: Family Medicine

## 2014-01-29 VITALS — BP 146/98 | HR 68 | Temp 98.2°F | Resp 20 | Ht 64.0 in | Wt 139.0 lb

## 2014-01-29 DIAGNOSIS — J449 Chronic obstructive pulmonary disease, unspecified: Secondary | ICD-10-CM

## 2014-01-29 NOTE — Progress Notes (Signed)
   Subjective:    Patient ID: Lauren Harrison, female    DOB: 03/01/1955, 58 y.o.   MRN: [id number]  HPI Please see the patient's last office visit. Patient developed bronchitis with airway obstruction. I treated the patient with steroids, Levaquin, and albuterol. She is now 57-70% better.  She denies any fevers or chills or hemoptysis. She continues to have a mild cough although it is improving. There is no further wheezing or shortness of breath. Past Medical History  Diagnosis Date  . Anemia   . Anxiety   . Arthritis   . Hypertension   . Smoker   . Insomnia   . Full dentures    Past Surgical History  Procedure Laterality Date  . Appendectomy    . Tubal ligation    . Back surgery  1995    br bx lt  . Breast lumpectomy with radioactive seed localization Left 01/05/2014    Procedure: LEFT BREAST LUMPECTOMY WITH RADIOACTIVE SEED LOCALIZATION;  Surgeon: Fanny Skates, MD;  Location: [place];  Service: General;  Laterality: Left;   Current Outpatient Prescriptions on File Prior to Visit  Medication Sig Dispense Refill  . albuterol (PROVENTIL HFA;VENTOLIN HFA) 108 (90 BASE) MCG/ACT inhaler Inhale 2 puffs into the lungs every 6 (six) hours as needed for wheezing or shortness of breath. 1 Inhaler 0  . ALPRAZolam (XANAX) 0.5 MG tablet TAKE 1 TABLET BY MOUTH AT BEDTIME AS NEEDED FOR SLEEP 30 tablet 2  . HYDROcodone-acetaminophen (NORCO) 5-325 MG per tablet Take 1-2 tablets by mouth every 6 (six) hours as needed for moderate pain or severe pain. 30 tablet 0  . losartan (COZAAR) 100 MG tablet Take 1 tablet (100 mg total) by mouth daily. 90 tablet 3   No current facility-administered medications on file prior to visit.   No Known Allergies History   Social History  . Marital Status: Married    Spouse Name: N/A    Number of Children: N/A  . Years of Education: N/A   Occupational History  . Not on file.   Social History Main Topics  . Smoking status: Current Every  Day Smoker -- 1.00 packs/day  . Smokeless tobacco: Not on file  . Alcohol Use: Yes     Comment: Occasional  . Drug Use: No  . Sexual Activity: Yes    Birth Control/ Protection: None   Other Topics Concern  . Not on file   Social History Narrative      Review of Systems  All other systems reviewed and are negative.      Objective:   Physical Exam  Cardiovascular: Normal rate and regular rhythm.   Pulmonary/Chest: Effort normal and breath sounds normal. No respiratory distress. She has no wheezes. She has no rales.  Abdominal: Soft. Bowel sounds are normal.  Vitals reviewed.         Assessment & Plan:  Bronchitis with airway obstruction  Patient is recovering. I strongly encouraged the patient to refrain from smoking. We discussed several strategies for smoking cessation. The patient would like to start with the nicotine patches over-the-counter.Marland Kitchen

## 2014-02-07 ENCOUNTER — Telehealth: Payer: Self-pay | Admitting: Family Medicine

## 2014-02-07 NOTE — Telephone Encounter (Signed)
lmtrc

## 2014-02-07 NOTE — Telephone Encounter (Signed)
Patient needs referral to [facility] because of insurance purposes  Please call her with questions at  [phone]

## 2014-03-21 ENCOUNTER — Telehealth: Payer: Self-pay | Admitting: *Deleted

## 2014-03-21 NOTE — Telephone Encounter (Signed)
Submitted referral thru [facility] compass for authorization/approval to Dr. Evalee Mutton with referral number [id number]  Type of referral: Consult and treat  Start Date: 03/21/14 end date 09/19/14  Specialist name: Wilmon Pali, MD  Specialist address: [address], [place] group name: [facility]  Dx BMWU:[id number]-NUUV in right hand                M25.561-pain in right knee  Also submitted another referral to same place [facility] with Referral number [id number] Dr. Evalee Mutton, MD start and end date are the same.  Dx code: M54.5- low back pain                R21- Rash and other nonspecific skin eruption

## 2014-04-03 ENCOUNTER — Other Ambulatory Visit: Payer: Self-pay | Admitting: Family Medicine

## 2014-04-03 NOTE — Telephone Encounter (Signed)
Medication called to pharmacy. 

## 2014-04-03 NOTE — Telephone Encounter (Signed)
ok 

## 2014-04-03 NOTE — Telephone Encounter (Signed)
?   OK to Refill  

## 2014-05-31 ENCOUNTER — Ambulatory Visit: Payer: Self-pay | Admitting: Family Medicine

## 2014-06-07 ENCOUNTER — Ambulatory Visit (INDEPENDENT_AMBULATORY_CARE_PROVIDER_SITE_OTHER): Payer: 59 | Admitting: Family Medicine

## 2014-06-07 ENCOUNTER — Encounter: Payer: Self-pay | Admitting: Family Medicine

## 2014-06-07 VITALS — BP 128/72 | HR 78 | Temp 97.5°F | Resp 18 | Wt 142.0 lb

## 2014-06-07 DIAGNOSIS — Z1211 Encounter for screening for malignant neoplasm of colon: Secondary | ICD-10-CM

## 2014-06-07 DIAGNOSIS — I10 Essential (primary) hypertension: Secondary | ICD-10-CM

## 2014-06-07 DIAGNOSIS — L821 Other seborrheic keratosis: Secondary | ICD-10-CM | POA: Diagnosis not present

## 2014-06-07 DIAGNOSIS — L918 Other hypertrophic disorders of the skin: Secondary | ICD-10-CM | POA: Diagnosis not present

## 2014-06-07 MED ORDER — ALPRAZOLAM 0.5 MG PO TABS: 0.5000 mg | ORAL_TABLET | Freq: Every evening | ORAL | Status: DC | PRN

## 2014-06-07 MED ORDER — LOSARTAN POTASSIUM 100 MG PO TABS: 100.0000 mg | ORAL_TABLET | Freq: Every day | ORAL | Status: DC

## 2014-06-07 NOTE — Progress Notes (Signed)
   Subjective:    Patient ID: Lauren Harrison, female    DOB: 03/12/1955, 59 y.o.   MRN: [id number]  HPI  She has a lesion on her left lower abdomen. It is a 1 cm in diameter brown warty papule consistent with a seborrheic keratosis. She also has a 3 mm skin tag in her right axilla. I removed this for free using a pair of scissors without anesthesia. She is also here for recheck of her blood pressure. Her blood pressures well controlled at 128/72. She is not due again for fasting lab work until December. Her mammogram is up-to-date. I did recommend a colonoscopy. She is also requesting a Xanax refill. She takes his medication every other night to help her sleep Past Medical History  Diagnosis Date  . Anemia   . Anxiety   . Arthritis   . Hypertension   . Smoker   . Insomnia   . Full dentures    Past Surgical History  Procedure Laterality Date  . Appendectomy    . Tubal ligation    . Back surgery  1995    br bx lt  . Breast lumpectomy with radioactive seed localization Left 01/05/2014    Procedure: LEFT BREAST LUMPECTOMY WITH RADIOACTIVE SEED LOCALIZATION;  Surgeon: Fanny Skates, MD;  Location: [place];  Service: General;  Laterality: Left;   No current outpatient prescriptions on file prior to visit.   No current facility-administered medications on file prior to visit.   No Known Allergies History   Social History  . Marital Status: Married    Spouse Name: N/A  . Number of Children: N/A  . Years of Education: N/A   Occupational History  . Not on file.   Social History Main Topics  . Smoking status: Current Every Day Smoker -- 1.00 packs/day  . Smokeless tobacco: Not on file  . Alcohol Use: Yes     Comment: Occasional  . Drug Use: No  . Sexual Activity: Yes    Birth Control/ Protection: None   Other Topics Concern  . Not on file   Social History Narrative     Review of Systems  All other systems reviewed and are negative.      Objective:     Physical Exam  Constitutional: She appears well-developed and well-nourished.  Cardiovascular: Normal rate, regular rhythm and normal heart sounds.   No murmur heard. Pulmonary/Chest: Effort normal and breath sounds normal. No respiratory distress. She has no wheezes. She has no rales.  Abdominal: Soft. Bowel sounds are normal.  Vitals reviewed.         Assessment & Plan:  Seborrheic keratoses  Skin tag  Benign essential HTN  Blood pressures well controlled. Continue losartan. Also gave the patient 30 Xanax and gave her 2 refills. This should last 4-5 months to be used sparingly for insomnia. I warned the patient about habituation and dependency on this medication. I will schedule her for a colonoscopy. I would like to see her back for complete physical exam in December.

## 2014-11-06 ENCOUNTER — Ambulatory Visit (INDEPENDENT_AMBULATORY_CARE_PROVIDER_SITE_OTHER): Payer: 59 | Admitting: Family Medicine

## 2014-11-06 DIAGNOSIS — Z23 Encounter for immunization: Secondary | ICD-10-CM | POA: Diagnosis not present

## 2014-11-12 ENCOUNTER — Ambulatory Visit: Payer: Self-pay | Admitting: Family Medicine

## 2014-12-20 ENCOUNTER — Telehealth: Payer: Self-pay | Admitting: Family Medicine

## 2014-12-20 MED ORDER — ALPRAZOLAM 0.5 MG PO TABS: 0.5000 mg | ORAL_TABLET | Freq: Every evening | ORAL | Status: DC | PRN

## 2014-12-20 NOTE — Telephone Encounter (Signed)
Approved for #[id number] 

## 2014-12-20 NOTE — Telephone Encounter (Signed)
Ok to refill??  Last office visit/ refill 06/07/2014, #2 refills.

## 2014-12-20 NOTE — Telephone Encounter (Signed)
Medication called to pharmacy. 

## 2014-12-20 NOTE — Telephone Encounter (Signed)
Pt is requesting a refill of Alprazolam until she can come back in to see Dr. Dennard Schaumann. She uses the [facility] in [place]. [name]: [phone]

## 2015-01-22 ENCOUNTER — Ambulatory Visit: Payer: 59 | Admitting: Family Medicine

## 2015-01-24 ENCOUNTER — Ambulatory Visit (INDEPENDENT_AMBULATORY_CARE_PROVIDER_SITE_OTHER): Payer: 59 | Admitting: Family Medicine

## 2015-01-24 ENCOUNTER — Encounter: Payer: Self-pay | Admitting: Family Medicine

## 2015-01-24 VITALS — BP 160/98 | HR 78 | Temp 98.5°F | Resp 18 | Wt 140.0 lb

## 2015-01-24 DIAGNOSIS — M25562 Pain in left knee: Secondary | ICD-10-CM | POA: Diagnosis not present

## 2015-01-24 DIAGNOSIS — I10 Essential (primary) hypertension: Secondary | ICD-10-CM | POA: Diagnosis not present

## 2015-01-24 MED ORDER — ALPRAZOLAM 0.5 MG PO TABS: 0.5000 mg | ORAL_TABLET | Freq: Every evening | ORAL | Status: DC | PRN

## 2015-01-24 MED ORDER — HYDROCHLOROTHIAZIDE 25 MG PO TABS: 25.0000 mg | ORAL_TABLET | Freq: Every day | ORAL | Status: DC

## 2015-01-24 MED ORDER — MELOXICAM 7.5 MG PO TABS: 7.5000 mg | ORAL_TABLET | Freq: Every day | ORAL | Status: DC

## 2015-01-24 NOTE — Progress Notes (Signed)
Subjective:    Patient ID: Lauren Harrison, female    DOB: 04-01-1955, 59 y.o.   MRN: [id number]  HPI Patient presents with a sore scratchy throat 2 days. She denies any fevers. Posterior oropharynx is clear on examination. However her blood pressure is extremely high at 160/98. She denies any chest pain shortness of breath or dyspnea on exertion. Unfortunately she continues to smoke. She also complains of pain in her left knee underneath the patella. She has increased laxity of the patellar ligament. She has a positive patellar grind. She also has tenderness in the posterior lateral joint line. She has no laxity to varus or valgus stress. She has a negative Apley grind Past Medical History  Diagnosis Date  . Anemia   . Anxiety   . Arthritis   . Hypertension   . Smoker   . Insomnia   . Full dentures    Past Surgical History  Procedure Laterality Date  . Appendectomy    . Tubal ligation    . Back surgery  1995    br bx lt  . Breast lumpectomy with radioactive seed localization Left 01/05/2014    Procedure: LEFT BREAST LUMPECTOMY WITH RADIOACTIVE SEED LOCALIZATION;  Surgeon: Fanny Skates, MD;  Location: [place];  Service: General;  Laterality: Left;   Current Outpatient Prescriptions on File Prior to Visit  Medication Sig Dispense Refill  . losartan (COZAAR) 100 MG tablet Take 1 tablet (100 mg total) by mouth daily. 90 tablet 3   No current facility-administered medications on file prior to visit.   No Known Allergies Social History   Social History  . Marital Status: Married    Spouse Name: N/A  . Number of Children: N/A  . Years of Education: N/A   Occupational History  . Not on file.   Social History Main Topics  . Smoking status: Current Every Day Smoker -- 1.00 packs/day  . Smokeless tobacco: Not on file  . Alcohol Use: Yes     Comment: Occasional  . Drug Use: No  . Sexual Activity: Yes    Birth Control/ Protection: None   Other Topics  Concern  . Not on file   Social History Narrative      Review of Systems  All other systems reviewed and are negative.      Objective:   Physical Exam  Constitutional: She appears well-developed and well-nourished.  HENT:  Right Ear: External ear normal.  Left Ear: External ear normal.  Nose: Nose normal.  Mouth/Throat: Oropharynx is clear and moist. No oropharyngeal exudate.  Eyes: Conjunctivae are normal.  Neck: Neck supple.  Cardiovascular: Normal rate, regular rhythm and normal heart sounds.   No murmur heard. Pulmonary/Chest: Effort normal and breath sounds normal. No respiratory distress. She has no wheezes. She has no rales. She exhibits no tenderness.  Abdominal: Soft. Bowel sounds are normal. She exhibits no distension and no mass. There is no tenderness. There is no rebound and no guarding.  Musculoskeletal: She exhibits tenderness.       Left knee: She exhibits decreased range of motion, abnormal patellar mobility and bony tenderness. She exhibits no LCL laxity, normal meniscus and no MCL laxity. Tenderness found. Lateral joint line tenderness noted. No medial joint line, no MCL, no LCL and no patellar tendon tenderness noted.  Lymphadenopathy:    She has no cervical adenopathy.  Vitals reviewed.         Assessment & Plan:  Left knee pain - Plan:  meloxicam (MOBIC) 7.5 MG tablet  Benign essential HTN - Plan: hydrochlorothiazide (HYDRODIURIL) 25 MG tablet, CBC with Differential/Platelet, COMPLETE METABOLIC PANEL WITH GFR, Lipid panel  Blood pressures elevated. Add hydrochlorothiazide 25 mg by mouth daily to losartan. Recheck blood pressure in 3 weeks. Return fasting for a CBC, CMP, fasting lipid panel. I suggested blocks a cam 7.5 mg by mouth daily for left knee pain which I presume is due to osteoarthritis. I believe her sore throat is just a simple cold and I recommended supportive care only. I continue to encourage smoking cessation and recommended Chantix

## 2015-02-05 ENCOUNTER — Telehealth: Payer: Self-pay | Admitting: Family Medicine

## 2015-02-05 NOTE — Telephone Encounter (Signed)
Pt made aware of provider recommendations.  Call next Monday with weeks worth of BP readings.  Stop HCTZ

## 2015-02-05 NOTE — Telephone Encounter (Signed)
I am sorry.  Please stop medicine and call me with several blood pressures off medication next Monday.

## 2015-02-05 NOTE — Telephone Encounter (Signed)
Pt says HCTZ is too strong.  BP have been too low.  1/2= 82/57,  78/57  The highest it's been 115/73, 110/76.   Gets real dizzy when so low  Please advise.

## 2015-03-04 ENCOUNTER — Telehealth: Payer: Self-pay | Admitting: Family Medicine

## 2015-03-04 ENCOUNTER — Other Ambulatory Visit: Payer: Self-pay

## 2015-03-04 LAB — CBC WITH DIFFERENTIAL/PLATELET
Basophils Absolute: 0.1 10*3/uL (ref 0.0–0.1)
Basophils Relative: 1 % (ref 0–1)
EOS ABS: 0.2 10*3/uL (ref 0.0–0.7)
Eosinophils Relative: 3 % (ref 0–5)
HCT: 37.5 % (ref 36.0–46.0)
Hemoglobin: 12.5 g/dL (ref 12.0–15.0)
Lymphocytes Relative: 34 % (ref 12–46)
Lymphs Abs: 1.9 10*3/uL (ref 0.7–4.0)
MCH: 29.1 pg (ref 26.0–34.0)
MCHC: 33.3 g/dL (ref 30.0–36.0)
MCV: 87.2 fL (ref 78.0–100.0)
MONO ABS: 0.4 10*3/uL (ref 0.1–1.0)
MONOS PCT: 7 % (ref 3–12)
MPV: 9.1 fL (ref 8.6–12.4)
Neutro Abs: 3.1 10*3/uL (ref 1.7–7.7)
Neutrophils Relative %: 55 % (ref 43–77)
PLATELETS: 297 10*3/uL (ref 150–400)
RBC: 4.3 MIL/uL (ref 3.87–5.11)
RDW: 14.1 % (ref 11.5–15.5)
WBC: 5.6 10*3/uL (ref 4.0–10.5)

## 2015-03-04 LAB — COMPLETE METABOLIC PANEL WITH GFR
ALT: 23 U/L (ref 6–29)
AST: 21 U/L (ref 10–35)
Albumin: 4.1 g/dL (ref 3.6–5.1)
Alkaline Phosphatase: 118 U/L (ref 33–130)
BUN: 9 mg/dL (ref 7–25)
CALCIUM: 9.6 mg/dL (ref 8.6–10.4)
CHLORIDE: 104 mmol/L (ref 98–110)
CO2: 28 mmol/L (ref 20–31)
CREATININE: 0.69 mg/dL (ref 0.50–1.05)
Glucose, Bld: 92 mg/dL (ref 70–99)
Potassium: 4.7 mmol/L (ref 3.5–5.3)
Sodium: 137 mmol/L (ref 135–146)
Total Bilirubin: 0.5 mg/dL (ref 0.2–1.2)
Total Protein: 6.4 g/dL (ref 6.1–8.1)

## 2015-03-04 LAB — LIPID PANEL
CHOLESTEROL: 186 mg/dL (ref 125–200)
HDL: 33 mg/dL — AB (ref >=46)
LDL Cholesterol: 131 mg/dL — ABNORMAL HIGH (ref <130)
Total CHOL/HDL Ratio: 5.6 Ratio — ABNORMAL HIGH (ref <=5.0)
Triglycerides: 111 mg/dL (ref <150)
VLDL: 22 mg/dL (ref <30)

## 2015-03-04 MED ORDER — ALPRAZOLAM 0.5 MG PO TABS: 0.5000 mg | ORAL_TABLET | Freq: Every evening | ORAL | Status: DC | PRN

## 2015-03-04 NOTE — Telephone Encounter (Signed)
Medication called/sent to requested pharmacy  

## 2015-03-04 NOTE — Telephone Encounter (Signed)
Patient requesting refill on her ALPRAZolam Duanne Moron) 0.5 MG tablet  She uses [facility] in [place]  CB# [phone]

## 2015-03-04 NOTE — Telephone Encounter (Signed)
?   OK to Refill  

## 2015-03-04 NOTE — Telephone Encounter (Signed)
ok 

## 2015-03-20 ENCOUNTER — Other Ambulatory Visit: Payer: Self-pay | Admitting: Family Medicine

## 2015-03-20 ENCOUNTER — Encounter: Payer: Self-pay | Admitting: Family Medicine

## 2015-03-20 MED ORDER — ATORVASTATIN CALCIUM 20 MG PO TABS: 20.0000 mg | ORAL_TABLET | Freq: Every day | ORAL | Status: DC

## 2015-03-21 ENCOUNTER — Ambulatory Visit (INDEPENDENT_AMBULATORY_CARE_PROVIDER_SITE_OTHER): Payer: BLUE CROSS/BLUE SHIELD | Admitting: Physician Assistant

## 2015-03-21 ENCOUNTER — Other Ambulatory Visit: Payer: Self-pay | Admitting: Physician Assistant

## 2015-03-21 ENCOUNTER — Encounter: Payer: Self-pay | Admitting: Physician Assistant

## 2015-03-21 VITALS — BP 122/70 | HR 120 | Temp 102.3°F | Resp 20 | Wt 138.0 lb

## 2015-03-21 DIAGNOSIS — R509 Fever, unspecified: Secondary | ICD-10-CM

## 2015-03-21 DIAGNOSIS — J029 Acute pharyngitis, unspecified: Secondary | ICD-10-CM

## 2015-03-21 DIAGNOSIS — J111 Influenza due to unidentified influenza virus with other respiratory manifestations: Secondary | ICD-10-CM

## 2015-03-21 LAB — STREP GROUP A AG, W/REFLEX TO CULT: STREGTOCOCCUS GROUP A AG SCREEN: NOT DETECTED

## 2015-03-21 LAB — INFLUENZA A AND B AG, IMMUNOASSAY
Influenza A Antigen: NOT DETECTED
Influenza B Antigen: NOT DETECTED

## 2015-03-21 MED ORDER — OSELTAMIVIR PHOSPHATE 75 MG PO CAPS: 75.0000 mg | ORAL_CAPSULE | Freq: Two times a day (BID) | ORAL | Status: DC

## 2015-03-21 NOTE — Progress Notes (Signed)
Patient ID: Lauren Harrison MRN: [id number], DOB: October 25, 1955, 60 y.o. Date of Encounter: 03/21/2015, 3:26 PM    Chief Complaint:  Chief Complaint  Patient presents with  . sick since last night    achy, chills, fever, sore throat, feels awful     HPI: 60 y.o. year old white female presents with above. Husband is here with her for visit.  They state that it is just the 2 of them at home. State that he (her husband)  has not been sick. However patient states that they have had family member recently die with hospice and so they have been back and forth to the hospital and hospice recently and could've picked up germs there. Pt also states that coworkers are constantly coming to work sick and that she could've picked up a germ from them.  Pt states that she just started getting sick last night with above symptoms.     Home Meds:   Outpatient Prescriptions Prior to Visit  Medication Sig Dispense Refill  . ALPRAZolam (XANAX) 0.5 MG tablet Take 1 tablet (0.5 mg total) by mouth at bedtime as needed. for sleep 30 tablet 0  . atorvastatin (LIPITOR) 20 MG tablet Take 1 tablet (20 mg total) by mouth daily. 90 tablet 0  . losartan (COZAAR) 100 MG tablet Take 1 tablet (100 mg total) by mouth daily. 90 tablet 3  . meloxicam (MOBIC) 7.5 MG tablet Take 1 tablet (7.5 mg total) by mouth daily. 30 tablet 2  . hydrochlorothiazide (HYDRODIURIL) 25 MG tablet Take 1 tablet (25 mg total) by mouth daily. 90 tablet 3   No facility-administered medications prior to visit.    Allergies: No Known Allergies    Review of Systems: See HPI for pertinent ROS. All other ROS negative.    Physical Exam: Blood pressure 122/70, pulse 120, temperature 102.3 F (39.1 C), temperature source Oral, resp. rate 20, weight 138 lb (62.596 kg)., Body mass index is 23.68 kg/(m^2). General:  WF. Lying on her side, on exam table, wrapped up in her coat.  HEENT: Normocephalic, atraumatic, eyes without discharge, sclera  non-icteric, nares are without discharge. Bilateral auditory canals clear, TM's are without perforation, pearly grey and translucent with reflective cone of light bilaterally. Oral cavity moist, posterior pharynx with mild erythema, no exudate, no peritonsillar abscess.  Neck: Supple. No thyromegaly. No lymphadenopathy. Lungs: Clear bilaterally to auscultation without wheezes, rales, or rhonchi. Breathing is unlabored. Lungs are clear. No wheezing or rhonchi. Heart: Regular rhythm. No murmurs, rubs, or gallops. Msk:  Strength and tone normal for age. Extremities/Skin: Warm and dry.  Neuro: Alert and oriented X 3. Moves all extremities spontaneously. Gait is normal. CNII-XII grossly in tact. Psych:  Responds to questions appropriately with a normal affect.     ASSESSMENT AND PLAN:  60 y.o. year old female with  1. Sorethroat - Influenza a and b - STREP GROUP A AG, W/REFLEX TO CULT  2. Fever and chills - Influenza a and b - STREP GROUP A AG, W/REFLEX TO CULT  Rapid strep test is negative and flu test is negative. However, fever 102.3 and clinically consistent with influenza. Will Go ahead and treat with Tamiflu to cover influenza. She is to also take Tylenol and Motrin to control her fever and continues over-the-counter medications for symptom relief. Follow-up if fever not decreased in 24-48 hours. Follow-up if symptoms worsen or are not improved/resolving in 7 days.     [address] [place], [place], [facility] 03/21/2015  3:26 PM

## 2015-03-22 ENCOUNTER — Ambulatory Visit: Payer: Self-pay | Admitting: Family Medicine

## 2015-04-08 ENCOUNTER — Other Ambulatory Visit: Payer: Self-pay | Admitting: Family Medicine

## 2015-04-08 NOTE — Telephone Encounter (Signed)
?   OK to Refill  

## 2015-04-08 NOTE — Telephone Encounter (Signed)
ok 

## 2015-04-09 NOTE — Telephone Encounter (Signed)
ok 

## 2015-05-21 ENCOUNTER — Emergency Department (HOSPITAL_COMMUNITY): Payer: BLUE CROSS/BLUE SHIELD

## 2015-05-21 ENCOUNTER — Encounter (HOSPITAL_COMMUNITY): Payer: Self-pay

## 2015-05-21 ENCOUNTER — Emergency Department (HOSPITAL_COMMUNITY)
Admission: EM | Admit: 2015-05-21 | Discharge: 2015-05-21 | Disposition: A | Discharge status: Home / Self Care | Payer: BLUE CROSS/BLUE SHIELD | Attending: Emergency Medicine | Admitting: Emergency Medicine

## 2015-05-21 DIAGNOSIS — Y929 Unspecified place or not applicable: Secondary | ICD-10-CM | POA: Insufficient documentation

## 2015-05-21 DIAGNOSIS — M1990 Unspecified osteoarthritis, unspecified site: Secondary | ICD-10-CM | POA: Insufficient documentation

## 2015-05-21 DIAGNOSIS — I10 Essential (primary) hypertension: Secondary | ICD-10-CM | POA: Insufficient documentation

## 2015-05-21 DIAGNOSIS — Y939 Activity, unspecified: Secondary | ICD-10-CM | POA: Diagnosis not present

## 2015-05-21 DIAGNOSIS — W19XXXA Unspecified fall, initial encounter: Secondary | ICD-10-CM | POA: Diagnosis not present

## 2015-05-21 DIAGNOSIS — F17200 Nicotine dependence, unspecified, uncomplicated: Secondary | ICD-10-CM | POA: Diagnosis not present

## 2015-05-21 DIAGNOSIS — Y999 Unspecified external cause status: Secondary | ICD-10-CM | POA: Insufficient documentation

## 2015-05-21 DIAGNOSIS — S99912A Unspecified injury of left ankle, initial encounter: Secondary | ICD-10-CM | POA: Diagnosis present

## 2015-05-21 DIAGNOSIS — Z79899 Other long term (current) drug therapy: Secondary | ICD-10-CM | POA: Insufficient documentation

## 2015-05-21 DIAGNOSIS — S93402A Sprain of unspecified ligament of left ankle, initial encounter: Secondary | ICD-10-CM | POA: Insufficient documentation

## 2015-05-21 MED ORDER — IBUPROFEN 800 MG PO TABS: 800.0000 mg | ORAL_TABLET | Freq: Three times a day (TID) | ORAL | Status: DC

## 2015-05-21 MED ORDER — IBUPROFEN 800 MG PO TABS
800.0000 mg | ORAL_TABLET | Freq: Once | ORAL | Status: AC
  Administered 2015-05-21: 800 mg via ORAL
  Filled 2015-05-21: qty 1

## 2015-05-21 NOTE — Discharge Instructions (Signed)
Ankle Sprain °An ankle sprain is an injury to the strong, fibrous tissues (ligaments) that hold your ankle bones together.  °HOME CARE  °· Put ice on your ankle for 1-2 days or as told by your doctor. °¨ Put ice in a plastic bag. °¨ Place a towel between your skin and the bag. °¨ Leave the ice on for 15-20 minutes at a time, every 2 hours while you are awake. °· Only take medicine as told by your doctor. °· Raise (elevate) your injured ankle above the level of your heart as much as possible for 2-3 days. °· Use crutches if your doctor tells you to. Slowly put your own weight on the affected ankle. Use the crutches until you can walk without pain. °· If you have a plaster splint: °¨ Do not rest it on anything harder than a pillow for 24 hours. °¨ Do not put weight on it. °¨ Do not get it wet. °¨ Take it off to shower or bathe. °· If given, use an elastic wrap or support stocking for support. Take the wrap off if your toes lose feeling (numb), tingle, or turn cold or blue. °· If you have an air splint: °¨ Add or let out air to make it comfortable. °¨ Take it off at night and to shower and bathe. °¨ Wiggle your toes and move your ankle up and down often while you are wearing it. °GET HELP IF: °· You have rapidly increasing bruising or puffiness (swelling). °· Your toes feel very cold. °· You lose feeling in your foot. °· Your medicine does not help your pain. °GET HELP RIGHT AWAY IF:  °· Your toes lose feeling (numb) or turn blue. °· You have severe pain that is increasing. °MAKE SURE YOU:  °· Understand these instructions. °· Will watch your condition. °· Will get help right away if you are not doing well or get worse. °  °This information is not intended to replace advice given to you by your health care provider. Make sure you discuss any questions you have with your health care provider. °  °Document Released: 07/08/2007 Document Revised: 02/09/2014 Document Reviewed: 08/03/2011 °Elsevier Interactive Patient  Education ©2016 [employer] ° °

## 2015-05-21 NOTE — ED Provider Notes (Signed)
CSN: [id number]     Arrival date & time 05/21/15  [id number] History   First MD Initiated Contact with Patient 05/21/15 0820     Chief Complaint  Patient presents with  . Foot Injury     (Consider location/radiation/quality/duration/timing/severity/associated sxs/prior Treatment) HPI   Lauren Harrison is a 60 y.o. female who presents to the Emergency Department complaining of left foot and ankle pain.  She reports an eversion injury to the left foot last evening.  Reports increasing pain through the night with worsening pain upon weight bearing.  She has not tried any therapies or medications for symptom relief.  She denies numbness or weakness of the extremity, radiating pain, open wounds or other injuries.     Past Medical History  Diagnosis Date  . Anemia   . Anxiety   . Arthritis   . Hypertension   . Smoker   . Insomnia   . Full dentures    Past Surgical History  Procedure Laterality Date  . Appendectomy    . Tubal ligation    . Back surgery  1995    br bx lt  . Breast lumpectomy with radioactive seed localization Left 01/05/2014    Procedure: LEFT BREAST LUMPECTOMY WITH RADIOACTIVE SEED LOCALIZATION;  Surgeon: Fanny Skates, MD;  Location: [facility];  Service: General;  Laterality: Left;   Family History  Problem Relation Age of Onset  . Cancer Father   . Heart disease Sister   . Stroke Mother   . Heart disease Mother    Social History  Substance Use Topics  . Smoking status: Current Every Day Smoker -- 1.00 packs/day  . Smokeless tobacco: None  . Alcohol Use: Yes     Comment: Occasional   OB History    No data available     Review of Systems  Constitutional: Negative for fever and chills.  Genitourinary: Negative for dysuria and difficulty urinating.  Musculoskeletal: Positive for joint swelling and arthralgias.  Skin: Negative for color change and wound.  All other systems reviewed and are negative.     Allergies  Review of patient's  allergies indicates no known allergies.  Home Medications   Prior to Admission medications   Medication Sig Start Date End Date Taking? Authorizing Provider  ALPRAZolam Duanne Moron) 0.5 MG tablet TAKE 1 TABLET BY MOUTH EVERY NIGHT AT BEDTIME AS NEEDED FOR SLEEP 04/08/15   Susy Frizzle, MD  atorvastatin (LIPITOR) 20 MG tablet Take 1 tablet (20 mg total) by mouth daily. 03/20/15   Susy Frizzle, MD  hydrochlorothiazide (HYDRODIURIL) 25 MG tablet Take 1 tablet (25 mg total) by mouth daily. 01/24/15   Susy Frizzle, MD  losartan (COZAAR) 100 MG tablet Take 1 tablet (100 mg total) by mouth daily. 06/07/14   Susy Frizzle, MD  meloxicam (MOBIC) 7.5 MG tablet Take 1 tablet (7.5 mg total) by mouth daily. 01/24/15   Susy Frizzle, MD  oseltamivir (TAMIFLU) 75 MG capsule Take 1 capsule (75 mg total) by mouth 2 (two) times daily. 03/21/15   Lonie Peak Dixon, PA-C   BP 154/84 mmHg  Pulse 85  Temp(Src) 97.5 F (36.4 C) (Oral)  Resp 16  Ht 5\' 4"  (1.626 m)  Wt 62.596 kg  BMI 23.68 kg/m2  SpO2 99% Physical Exam  Constitutional: She is oriented to person, place, and time. She appears well-developed and well-nourished. No distress.  HENT:  Head: Normocephalic and atraumatic.  Neck: Normal range of motion. Neck supple.  Cardiovascular:  Normal rate, regular rhythm and intact distal pulses.   Pulmonary/Chest: Effort normal and breath sounds normal. No respiratory distress.  Musculoskeletal: She exhibits tenderness.  ttp of the lateral left ankle and lateral foot.  Minimal edema.  DP pulse is brisk,distal sensation intact.  No erythema, abrasion, bruising or bony deformity.  No proximal tenderness.  Compartments soft  Neurological: She is alert and oriented to person, place, and time. She exhibits normal muscle tone. Coordination normal.  Skin: Skin is warm and dry.  Psychiatric: She has a normal mood and affect.  Nursing note and vitals reviewed.   ED Course  Procedures (including critical care  time) Labs Review Labs Reviewed - No data to display  Imaging Review Dg Ankle Complete Left  05/21/2015  CLINICAL DATA:  Fall last night. Lateral ankle and foot pain with temporary numbness. EXAM: LEFT ANKLE COMPLETE - 3+ VIEW COMPARISON:  None. FINDINGS: The mineralization and alignment are normal. There is no evidence of acute fracture or dislocation. The joint spaces are maintained. No focal soft tissue abnormalities observed. IMPRESSION: No acute osseous findings. Electronically Signed   By: Richardean Sale M.D.   On: 05/21/2015 09:00   Dg Foot Complete Left  05/21/2015  CLINICAL DATA:  Fall last night. Lateral ankle and foot pain with temporary numbness. EXAM: LEFT FOOT - COMPLETE 3+ VIEW COMPARISON:  None. FINDINGS: The mineralization and alignment are normal. There is no evidence of acute fracture or dislocation. Minimal joint space loss at the first metatarsal phalangeal joint. The Lisfranc joint alignment appears normal. No focal soft tissue abnormalities are seen. IMPRESSION: No acute osseous findings. Electronically Signed   By: Richardean Sale M.D.   On: 05/21/2015 08:59   I have personally reviewed and evaluated these images and lab results as part of my medical decision-making.   EKG Interpretation None      MDM   Final diagnoses:  Ankle sprain, left, initial encounter    Pt is well appearing, ambulates with a steady gait.  NVI.  XR neg for fx.  Likely sprain,.  She agrees to symtpomatic tx and close orthopedic f/u.  Crutches and ASO applied.     Kem Parkinson, PA-C 05/21/15 [place], MD 05/21/15 [phone]

## 2015-05-21 NOTE — ED Notes (Signed)
Pt states her left foot was asleep when she got up and she fell. Complain of pain in left foot and abrasions to both knees

## 2015-06-11 ENCOUNTER — Other Ambulatory Visit: Payer: Self-pay | Admitting: Family Medicine

## 2015-06-11 MED ORDER — LOSARTAN POTASSIUM 100 MG PO TABS: 100.0000 mg | ORAL_TABLET | Freq: Every day | ORAL | Status: DC

## 2015-06-11 NOTE — Addendum Note (Signed)
Addended by: Sheral Flow on: 06/11/2015 03:14 PM   Modules accepted: Orders

## 2015-06-11 NOTE — Telephone Encounter (Signed)
Pt needs a refill of Losartan 100 mg. The pharmacy has been trying to fax [place] but has not heard anything back. She has one pill left [facility] in [place]

## 2015-06-11 NOTE — Telephone Encounter (Signed)
Prescription sent to pharmacy.

## 2015-06-24 ENCOUNTER — Other Ambulatory Visit: Payer: Self-pay | Admitting: Physician Assistant

## 2015-06-24 ENCOUNTER — Encounter: Payer: Self-pay | Admitting: Physician Assistant

## 2015-06-24 ENCOUNTER — Ambulatory Visit (INDEPENDENT_AMBULATORY_CARE_PROVIDER_SITE_OTHER): Payer: BLUE CROSS/BLUE SHIELD | Admitting: Physician Assistant

## 2015-06-24 VITALS — BP 142/70 | HR 64 | Temp 97.7°F | Resp 18 | Wt 142.0 lb

## 2015-06-24 DIAGNOSIS — R1030 Lower abdominal pain, unspecified: Secondary | ICD-10-CM

## 2015-06-24 LAB — URINALYSIS, MICROSCOPIC ONLY
Casts: NONE SEEN [LPF]
Crystals: NONE SEEN [HPF]
Yeast: NONE SEEN [HPF]

## 2015-06-24 LAB — URINALYSIS, ROUTINE W REFLEX MICROSCOPIC
Bilirubin Urine: NEGATIVE
GLUCOSE, UA: NEGATIVE
Ketones, ur: NEGATIVE
NITRITE: NEGATIVE
PH: 6.5 (ref 5.0–8.0)
PROTEIN: NEGATIVE
Specific Gravity, Urine: 1.025 (ref 1.001–1.035)

## 2015-06-24 LAB — CBC
HEMATOCRIT: 35.1 % (ref 35.0–45.0)
HEMOGLOBIN: 11.8 g/dL — AB (ref 12.0–15.0)
MCH: 29.6 pg (ref 27.0–33.0)
MCHC: 33.6 g/dL (ref 32.0–36.0)
MCV: 88 fL (ref 80.0–100.0)
MPV: 99.8 fL — AB (ref 7.5–12.5)
Platelets: 285 10*3/uL (ref 140–400)
RBC: 3.99 MIL/uL (ref 3.80–5.10)
RDW: 14.1 % (ref 11.0–15.0)
WBC: 7.2 10*3/uL (ref 3.8–10.8)

## 2015-06-24 NOTE — Progress Notes (Signed)
Patient ID: REGINE GERMER MRN: [id number], DOB: 1955-03-11, 60 y.o. Date of Encounter: @DATE @  Chief Complaint:  Chief Complaint  Patient presents with  . lower abd pain and flank pain    describes as a constant pressure feeling, has had some rectal bleeding thought hemarrhoids    HPI: 60 y.o. year old white female  presents with above.   She says this discomfort started about 3-4 days ago. Says it feels like a "pressure" in lower abdomen. Says it is constant but says it decreases in severity when sitting still and increases when she moves around/changes position. Husband notes that "she holds to her lower abdomen" when she goes from sitting to standing and when she goes from standing to sitting.   She has had no dysuria. Urination does not seem to affect pain.   She says she has noticed no change in bowel habits.  However, says her last BM was day before yesterday--husband then adds that "she usually has BM every morning".  Has had no diarrhea. No nausea. No vomiting.  Says she sometimes has to strain with BMs.   No fever/chills.    Past Medical History  Diagnosis Date  . Anemia   . Anxiety   . Arthritis   . Hypertension   . Smoker   . Insomnia   . Full dentures      Home Meds: Outpatient Prescriptions Prior to Visit  Medication Sig Dispense Refill  . ALPRAZolam (XANAX) 0.5 MG tablet TAKE 1 TABLET BY MOUTH EVERY NIGHT AT BEDTIME AS NEEDED FOR SLEEP 30 tablet 2  . losartan (COZAAR) 100 MG tablet Take 1 tablet (100 mg total) by mouth daily. 90 tablet 3  . atorvastatin (LIPITOR) 20 MG tablet Take 1 tablet (20 mg total) by mouth daily. (Patient not taking: Reported on 06/24/2015) 90 tablet 0  . hydrochlorothiazide (HYDRODIURIL) 25 MG tablet Take 1 tablet (25 mg total) by mouth daily. (Patient not taking: Reported on 06/24/2015) 90 tablet 3  . ibuprofen (ADVIL,MOTRIN) 800 MG tablet Take 1 tablet (800 mg total) by mouth 3 (three) times daily. (Patient not taking: Reported on  06/24/2015) 21 tablet 0  . meloxicam (MOBIC) 7.5 MG tablet Take 1 tablet (7.5 mg total) by mouth daily. (Patient not taking: Reported on 06/24/2015) 30 tablet 2  . oseltamivir (TAMIFLU) 75 MG capsule Take 1 capsule (75 mg total) by mouth 2 (two) times daily. 10 capsule 0   No facility-administered medications prior to visit.    Allergies: No Known Allergies  Social History   Social History  . Marital Status: Married    Spouse Name: N/A  . Number of Children: N/A  . Years of Education: N/A   Occupational History  . Not on file.   Social History Main Topics  . Smoking status: Current Every Day Smoker -- 1.00 packs/day  . Smokeless tobacco: Not on file  . Alcohol Use: Yes     Comment: Occasional  . Drug Use: No  . Sexual Activity: Yes    Birth Control/ Protection: None   Other Topics Concern  . Not on file   Social History Narrative    Family History  Problem Relation Age of Onset  . Cancer Father   . Heart disease Sister   . Stroke Mother   . Heart disease Mother      Review of Systems:  See HPI for pertinent ROS. All other ROS negative.    Physical Exam: Blood pressure 142/70, pulse 64, temperature  97.7 F (36.5 C), temperature source Oral, resp. rate 18, weight 142 lb (64.411 kg)., Body mass index is 24.36 kg/(m^2). General: WNWD WF. Appears in no acute distress. Neck: Supple. No thyromegaly. No lymphadenopathy. Lungs: Clear bilaterally to auscultation without wheezes, rales, or rhonchi. Breathing is unlabored. Heart: RRR with S1 S2. No murmurs, rubs, or gallops. Abdomen: Soft,  non-distended with normoactive bowel sounds. No hepatomegaly.  No obvious abdominal masses. Focal area of tenderness with palpation of left lower quadrant.  Musculoskeletal:  Strength and tone normal for age. Extremities/Skin: Warm and dry.  Neuro: Alert and oriented X 3. Moves all extremities spontaneously. Gait is normal. CNII-XII grossly in tact. Psych:  Responds to questions  appropriately with a normal affect.     ASSESSMENT AND PLAN:  60 y.o. year old female with  1. Lower abdominal pain - Urine culture - Urinalysis, Routine w reflex microscopic (not at [facility]) - CBC w/[facility] & 3 Part Diff - COMPLETE METABOLIC PANEL WITH GFR - CT Abdomen Pelvis W Wo Contrast; Future  UA reviewed during OV. No sign of UTI.  CBC also run and reviewed during OV. No sign of bacterial infection. No anemia. Will obtain CT--scheduled for tomorrow morning. If pain worsens in interim, go to ER.  O/W, I will f/u with her once I get CT result.   [address] [place], [place], [facility] 06/24/2015 4:57 PM

## 2015-06-25 ENCOUNTER — Ambulatory Visit (HOSPITAL_COMMUNITY)
Admission: RE | Admit: 2015-06-25 | Discharge: 2015-06-25 | Disposition: A | Discharge status: Home / Self Care | Payer: BLUE CROSS/BLUE SHIELD | Source: Ambulatory Visit | Attending: Physician Assistant | Admitting: Physician Assistant

## 2015-06-25 ENCOUNTER — Other Ambulatory Visit: Payer: Self-pay | Admitting: Family Medicine

## 2015-06-25 ENCOUNTER — Ambulatory Visit (HOSPITAL_COMMUNITY)
Admission: RE | Admit: 2015-06-25 | Discharge: 2015-06-25 | Disposition: A | Discharge status: Home / Self Care | Payer: BLUE CROSS/BLUE SHIELD | Source: Ambulatory Visit | Attending: Family Medicine | Admitting: Family Medicine

## 2015-06-25 DIAGNOSIS — R1030 Lower abdominal pain, unspecified: Secondary | ICD-10-CM | POA: Diagnosis present

## 2015-06-25 DIAGNOSIS — K5730 Diverticulosis of large intestine without perforation or abscess without bleeding: Secondary | ICD-10-CM | POA: Insufficient documentation

## 2015-06-25 DIAGNOSIS — R1032 Left lower quadrant pain: Secondary | ICD-10-CM

## 2015-06-25 DIAGNOSIS — I700 Atherosclerosis of aorta: Secondary | ICD-10-CM | POA: Diagnosis not present

## 2015-06-25 LAB — COMPLETE METABOLIC PANEL WITH GFR
ALBUMIN: 4 g/dL (ref 3.6–5.1)
ALK PHOS: 114 U/L (ref 33–130)
ALT: 28 U/L (ref 6–29)
AST: 19 U/L (ref 10–35)
BILIRUBIN TOTAL: 0.3 mg/dL (ref 0.2–1.2)
BUN: 9 mg/dL (ref 7–25)
CO2: 27 mmol/L (ref 20–31)
CREATININE: 0.78 mg/dL (ref 0.50–1.05)
Calcium: 9.2 mg/dL (ref 8.6–10.4)
Chloride: 103 mmol/L (ref 98–110)
GFR, Est African American: >89 mL/min (ref >=60)
GFR, Est Non African American: 83 mL/min (ref >=60)
GLUCOSE: 94 mg/dL (ref 70–99)
POTASSIUM: 4.5 mmol/L (ref 3.5–5.3)
SODIUM: 138 mmol/L (ref 135–146)
TOTAL PROTEIN: 6.3 g/dL (ref 6.1–8.1)

## 2015-06-25 MED ORDER — IOPAMIDOL (ISOVUE-300) INJECTION 61%
100.0000 mL | Freq: Once | INTRAVENOUS | Status: AC | PRN
  Administered 2015-06-25: 100 mL via INTRAVENOUS

## 2015-06-26 LAB — URINE CULTURE
Colony Count: NO GROWTH
Organism ID, Bacteria: NO GROWTH

## 2015-07-15 ENCOUNTER — Other Ambulatory Visit: Payer: Self-pay | Admitting: Family Medicine

## 2015-07-15 NOTE — Telephone Encounter (Signed)
Ok to refill??  Last office visit 06/24/2015.  Last refill 04/08/2015, #2 refills.

## 2015-07-16 NOTE — Telephone Encounter (Signed)
ok 

## 2015-10-23 ENCOUNTER — Other Ambulatory Visit: Payer: Self-pay | Admitting: Family Medicine

## 2015-10-23 NOTE — Telephone Encounter (Signed)
Ok to refill 

## 2015-10-24 NOTE — Telephone Encounter (Signed)
Medication called to pharmacy. 

## 2015-10-24 NOTE — Telephone Encounter (Signed)
ok 

## 2015-10-25 ENCOUNTER — Ambulatory Visit (INDEPENDENT_AMBULATORY_CARE_PROVIDER_SITE_OTHER): Payer: BLUE CROSS/BLUE SHIELD

## 2015-10-25 DIAGNOSIS — Z23 Encounter for immunization: Secondary | ICD-10-CM | POA: Diagnosis not present

## 2015-11-13 ENCOUNTER — Encounter: Payer: Self-pay | Admitting: Physician Assistant

## 2015-11-13 ENCOUNTER — Ambulatory Visit (INDEPENDENT_AMBULATORY_CARE_PROVIDER_SITE_OTHER): Payer: BLUE CROSS/BLUE SHIELD | Admitting: Physician Assistant

## 2015-11-13 VITALS — BP 140/72 | HR 74 | Temp 97.7°F | Resp 18 | Wt 141.0 lb

## 2015-11-13 DIAGNOSIS — Z1283 Encounter for screening for malignant neoplasm of skin: Secondary | ICD-10-CM | POA: Diagnosis not present

## 2015-11-13 NOTE — Progress Notes (Signed)
    Patient ID: Lauren Harrison MRN: [id number], DOB: 04-25-1955, 60 y.o. Date of Encounter: 11/13/2015, 10:40 AM    Chief Complaint:  Chief Complaint  Patient presents with  . office visit    spots appearing on back      HPI: 60 y.o. year old white female presents with above.   She says that she has been noticing more and more spots on her skin the older she gets. Says her daughter also has noticed areas on her back that her daughter thought she should get checked. --- The specific lesions that she points to all appear to be seborrheic keratoses and cherry hemangiomas.  Says that years ago Dr. Dennard Schaumann removed a spot on her skin that he was concerned about. She does not remember what that biopsy showed. -----I reviewed her chart. 10/2013 he did a biopsy that came back basal cell carcinoma.    Home Meds:   Outpatient Medications Prior to Visit  Medication Sig Dispense Refill  . ALPRAZolam (XANAX) 0.5 MG tablet TAKE 1 TABLET BY MOUTH EVERY NIGHT AT BEDTIME AS NEEDED FOR INSOMNIA 30 tablet 0  . hydrochlorothiazide (HYDRODIURIL) 25 MG tablet Take 1 tablet (25 mg total) by mouth daily. 90 tablet 3  . ibuprofen (ADVIL,MOTRIN) 800 MG tablet Take 1 tablet (800 mg total) by mouth 3 (three) times daily. 21 tablet 0  . losartan (COZAAR) 100 MG tablet Take 1 tablet (100 mg total) by mouth daily. 90 tablet 3  . atorvastatin (LIPITOR) 20 MG tablet Take 1 tablet (20 mg total) by mouth daily. (Patient not taking: Reported on 11/13/2015) 90 tablet 0  . meloxicam (MOBIC) 7.5 MG tablet Take 1 tablet (7.5 mg total) by mouth daily. (Patient not taking: Reported on 11/13/2015) 30 tablet 2   No facility-administered medications prior to visit.     Allergies: No Known Allergies    Review of Systems: See HPI for pertinent ROS. All other ROS negative.    Physical Exam: Blood pressure 140/72, pulse 74, temperature 97.7 F (36.5 C), temperature source Oral, resp. rate 18, weight 141 lb (64 kg), SpO2 99  %., Body mass index is 24.2 kg/m. General: WNWD WF. Appears in no acute distress. Neck: Supple. No thyromegaly. No lymphadenopathy. Lungs: Clear bilaterally to auscultation without wheezes, rales, or rhonchi. Breathing is unlabored. Heart: Regular rhythm. No murmurs, rubs, or gallops. Msk:  Strength and tone normal for age. Skin: She has lots of skin lesions---on her back, chest, abdomen.  The specific lesions she is concerned about all are c/w seborrheic keratosis, cherry hemangiomas.  However given the number of lesions she has and her history of basal cell carcinoma I feel that she needs a complete skin check by dermatologist. She is agreeable. Neuro: Alert and oriented X 3. Moves all extremities spontaneously. Gait is normal. CNII-XII grossly in tact. Psych:  Responds to questions appropriately with a normal affect.     ASSESSMENT AND PLAN:  60 y.o. year old female with  1. Skin cancer screening  Given the number of lesions she has-- and her history of basal cell carcinoma--- I feel that she needs a complete skin check by dermatologist. She is agreeable.  - Ambulatory referral to Dermatology   Signed, [facility] , [place], [facility] 11/13/2015 10:40 AM

## 2015-12-02 ENCOUNTER — Other Ambulatory Visit: Payer: Self-pay | Admitting: Family Medicine

## 2015-12-02 MED ORDER — ALPRAZOLAM 0.5 MG PO TABS: ORAL_TABLET | ORAL | 0 refills | Status: DC

## 2015-12-02 NOTE — Telephone Encounter (Signed)
LRF 10/24/15 #30   LOV 11/13/15  OK refill?

## 2015-12-02 NOTE — Telephone Encounter (Signed)
I reviewed her chart.  She needs to come in for ROV to f/u BP and f/u cholesterol.  Schedule OV for early morning--come fasting to that OV.  Tell her I will send in one refill on this med for her to be using in the interrim.  # 30 + 0

## 2015-12-02 NOTE — Telephone Encounter (Signed)
Medication refill for one time only.  Patient needs to be seen for routine OV and fasting labs per provider. Letter sent for patient to call and schedule

## 2016-03-12 ENCOUNTER — Encounter: Payer: Self-pay | Admitting: Family Medicine

## 2016-03-12 ENCOUNTER — Ambulatory Visit (INDEPENDENT_AMBULATORY_CARE_PROVIDER_SITE_OTHER): Payer: BLUE CROSS/BLUE SHIELD | Admitting: Family Medicine

## 2016-03-12 VITALS — BP 158/96 | HR 68 | Temp 97.7°F | Resp 16 | Ht 64.0 in | Wt 140.0 lb

## 2016-03-12 DIAGNOSIS — I10 Essential (primary) hypertension: Secondary | ICD-10-CM

## 2016-03-12 LAB — COMPLETE METABOLIC PANEL WITH GFR
ALBUMIN: 4.2 g/dL (ref 3.6–5.1)
ALK PHOS: 110 U/L (ref 33–130)
ALT: 24 U/L (ref 6–29)
AST: 21 U/L (ref 10–35)
BILIRUBIN TOTAL: 0.4 mg/dL (ref 0.2–1.2)
BUN: 11 mg/dL (ref 7–25)
CALCIUM: 9.5 mg/dL (ref 8.6–10.4)
CO2: 26 mmol/L (ref 20–31)
Chloride: 103 mmol/L (ref 98–110)
Creat: 0.7 mg/dL (ref 0.50–0.99)
GLUCOSE: 90 mg/dL (ref 70–99)
POTASSIUM: 4.3 mmol/L (ref 3.5–5.3)
Sodium: 137 mmol/L (ref 135–146)
TOTAL PROTEIN: 6.9 g/dL (ref 6.1–8.1)

## 2016-03-12 LAB — LIPID PANEL
CHOLESTEROL: 211 mg/dL — AB (ref <200)
HDL: 41 mg/dL — AB (ref >50)
LDL Cholesterol: 147 mg/dL — ABNORMAL HIGH (ref <100)
TRIGLYCERIDES: 115 mg/dL (ref <150)
Total CHOL/HDL Ratio: 5.1 Ratio — ABNORMAL HIGH (ref <5.0)
VLDL: 23 mg/dL (ref <30)

## 2016-03-12 MED ORDER — VARENICLINE TARTRATE 0.5 MG X 11 & 1 MG X 42 PO MISC: ORAL | 0 refills | Status: DC

## 2016-03-12 MED ORDER — ALPRAZOLAM 0.5 MG PO TABS: ORAL_TABLET | ORAL | 2 refills | Status: DC

## 2016-03-12 NOTE — Progress Notes (Signed)
Subjective:    Patient ID: Lauren Harrison, female    DOB: 07-03-1955, 61 y.o.   MRN: [id number]  HPI Patient is here today to get a refill on her Xanax. She is a very sweet 61 year old female who takes Xanax sparingly to help her sleep at night. At most she takes One-A-Day. There is no evidence of abuse or diversion. However I'm very concerned by her blood pressure and she continues to smoke. I am very concerned about this causing heart disease in the future. I recommended adding something else to her blood pressure however she states that in the past we did this it dropped her blood pressure too much and therefore she is afraid to do this. After a long discussion she is willing to consider smoking cessation in trying Chantix again. At the present time she smokes a pack of cigarettes per day Past Medical History:  Diagnosis Date  . Anemia   . Anxiety   . Arthritis   . Full dentures   . Hypertension   . Insomnia   . Smoker    Past Surgical History:  Procedure Laterality Date  . APPENDECTOMY    . [place]   br bx lt  . BREAST LUMPECTOMY WITH RADIOACTIVE SEED LOCALIZATION Left 01/05/2014   Procedure: LEFT BREAST LUMPECTOMY WITH RADIOACTIVE SEED LOCALIZATION;  Surgeon: Fanny Skates, MD;  Location: [facility];  Service: General;  Laterality: Left;  . TUBAL LIGATION     Current Outpatient Prescriptions on File Prior to Visit  Medication Sig Dispense Refill  . losartan (COZAAR) 100 MG tablet Take 1 tablet (100 mg total) by mouth daily. 90 tablet 3   No current facility-administered medications on file prior to visit.    No Known Allergies Social History   Social History  . Marital status: Married    Spouse name: N/A  . Number of children: N/A  . Years of education: N/A   Occupational History  . Not on file.   Social History Main Topics  . Smoking status: Current Every Day Smoker    Packs/day: 1.00  . Smokeless tobacco: Never Used  . Alcohol use Yes       Comment: Occasional  . Drug use: No  . Sexual activity: Yes    Birth control/ protection: None   Other Topics Concern  . Not on file   Social History Narrative  . No narrative on file      Review of Systems  All other systems reviewed and are negative.      Objective:   Physical Exam  Cardiovascular: Normal rate, regular rhythm and normal heart sounds.   Pulmonary/Chest: Effort normal and breath sounds normal. No respiratory distress. She has no wheezes. She has no rales.  Abdominal: Soft. Bowel sounds are normal. She exhibits no distension. There is no tenderness. There is no rebound.  Musculoskeletal: She exhibits no edema.  Vitals reviewed.         Assessment & Plan:  Benign essential HTN - Plan: COMPLETE METABOLIC PANEL WITH GFR, Lipid panel Patient's blood pressure significantly elevated. She will check her blood pressure everyday and notify me in a week with the values are. If they're greater than 140/90, I will add medication to lower this down and she is willing to do that if that is in fact the case. After a long discussion, she is willing to try Chantix. Therefore I gave her a prescription for a starter pack. I gladly refill her  Xanax. Recheck blood pressure in one week

## 2016-03-13 ENCOUNTER — Encounter: Payer: Self-pay | Admitting: Family Medicine

## 2016-03-16 ENCOUNTER — Other Ambulatory Visit: Payer: Self-pay | Admitting: Family Medicine

## 2016-03-16 ENCOUNTER — Encounter: Payer: Self-pay | Admitting: Family Medicine

## 2016-03-16 MED ORDER — ATORVASTATIN CALCIUM 20 MG PO TABS: 20.0000 mg | ORAL_TABLET | Freq: Every day | ORAL | 3 refills | Status: DC

## 2016-03-25 ENCOUNTER — Telehealth: Payer: Self-pay | Admitting: Family Medicine

## 2016-03-25 NOTE — Telephone Encounter (Signed)
Pt called and states that she has been checking her BP's at home and have been running 120's / 50-60's. She just wanted to let you know.

## 2016-03-26 NOTE — Telephone Encounter (Signed)
Those are good

## 2016-05-19 IMAGING — DX DG ANKLE COMPLETE 3+V*L*
3 series · 3 of 3 positions shown · non-contrast
Comparison: None.

CLINICAL DATA: Fall last night. Lateral ankle and foot pain with
temporary numbness.

EXAM:
LEFT ANKLE COMPLETE - 3+ VIEW

[ankle ap]
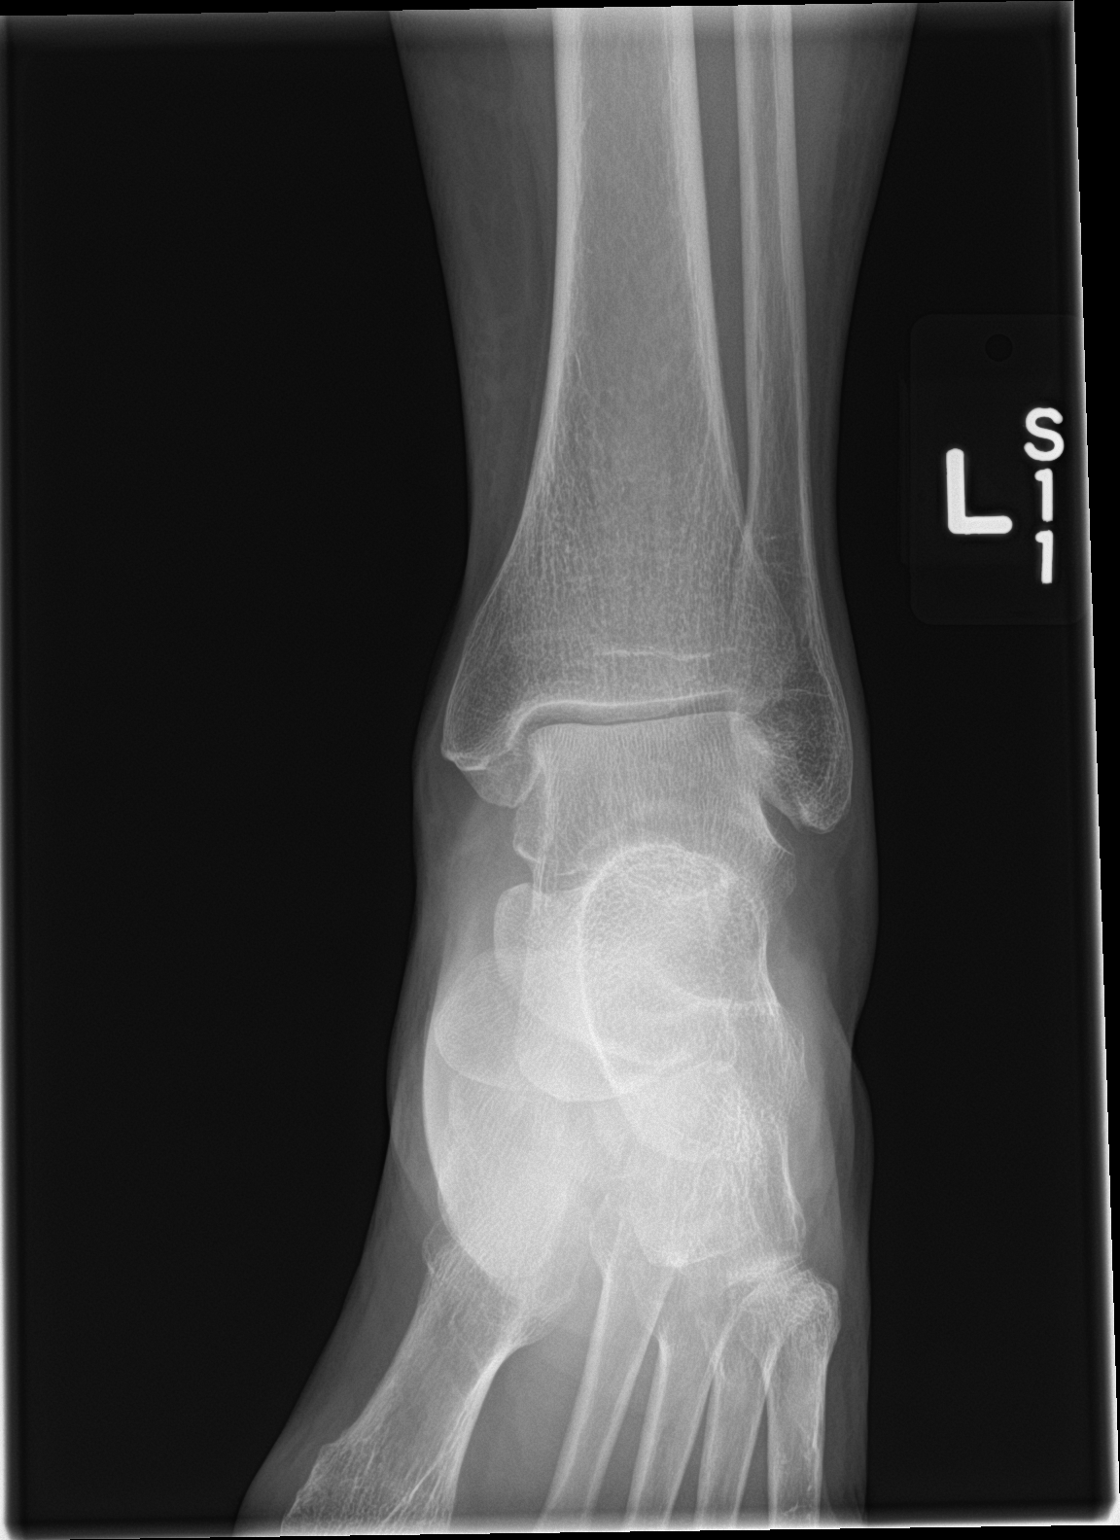

[ankle obl]
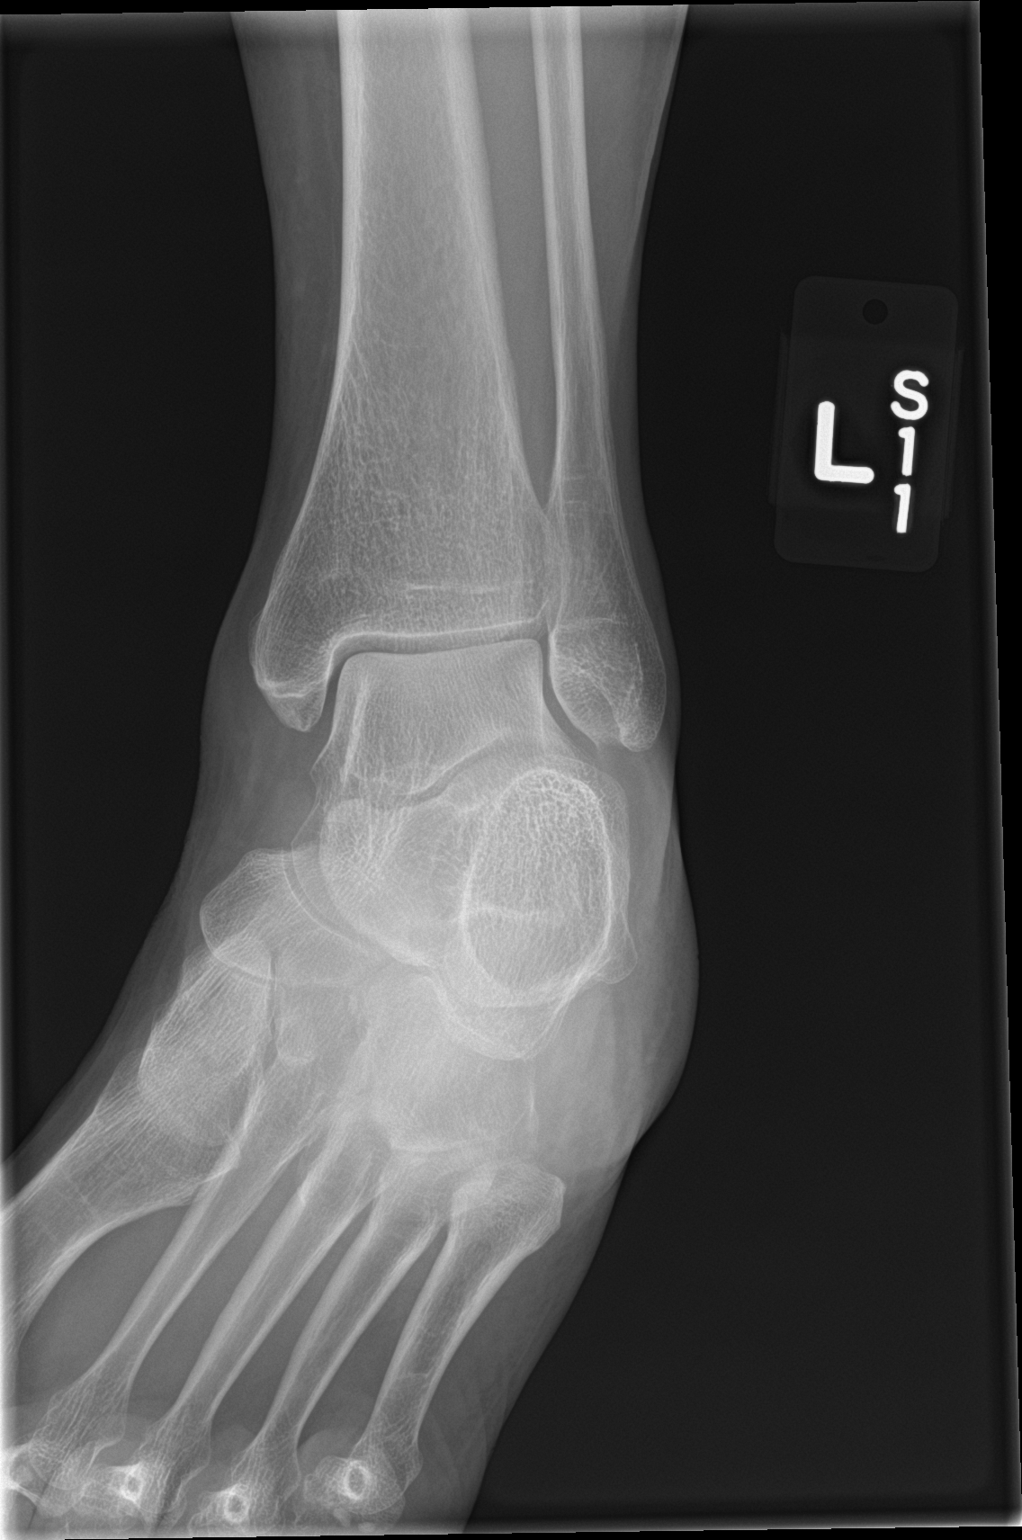

[ankle lat]
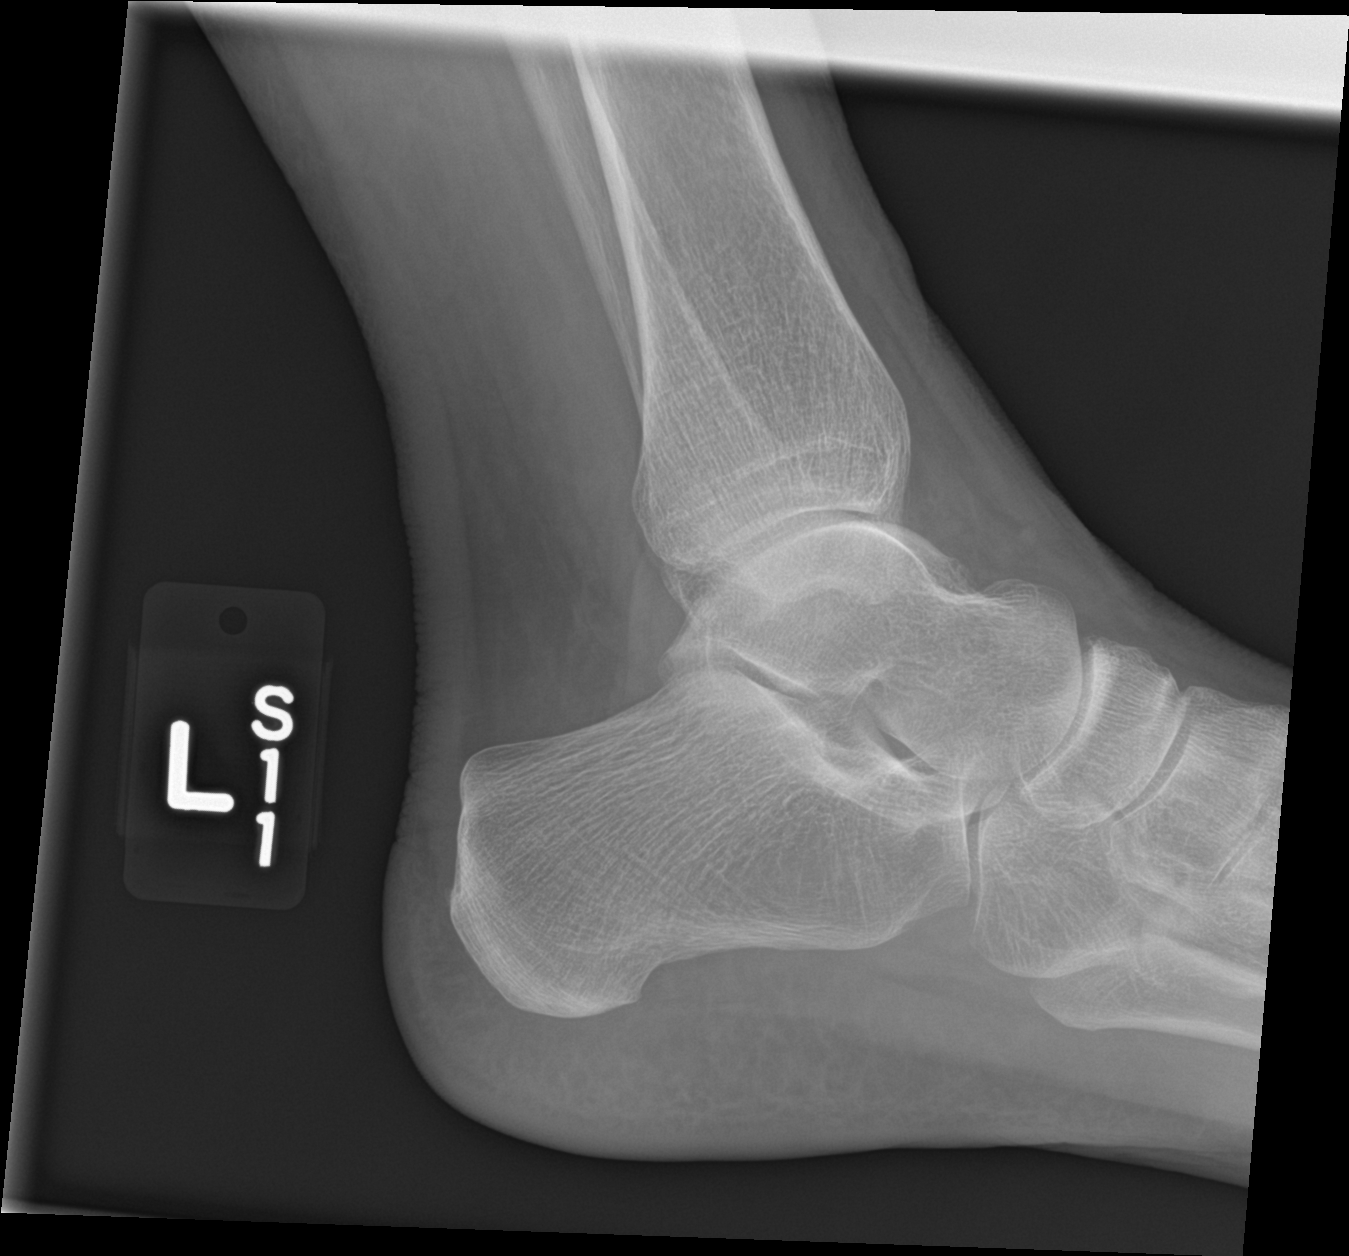

[3 of 3 positions shown; findings below may reference images not displayed]

FINDINGS: The mineralization and alignment are normal. There is no evidence of
acute fracture or dislocation. The joint spaces are maintained. No
focal soft tissue abnormalities observed.
IMPRESSION: No acute osseous findings.

## 2016-06-23 IMAGING — CT CT PELVIS W/ CM
2 of 4 series · 17 of 46 positions shown, 19 images · IV contrast (iopamidol)
Comparison: None.

CLINICAL DATA: Left lower quadrant pain, pelvic pain for 3-4 days.

EXAM:
CT PELVIS WITH CONTRAST
TECHNIQUE: Multidetector CT imaging of the pelvis was performed using the
standard protocol following the bolus administration of intravenous
contrast.
CONTRAST:  100mL [id number] IOPAMIDOL ([id number]) INJECTION 61%

[Series 2: routine abd pel with · axial · 0.65mm/px · z∈[-401,-186]mm · 14 of 51 slices shown, 16 images]
[im 4/51  soft-tissue]
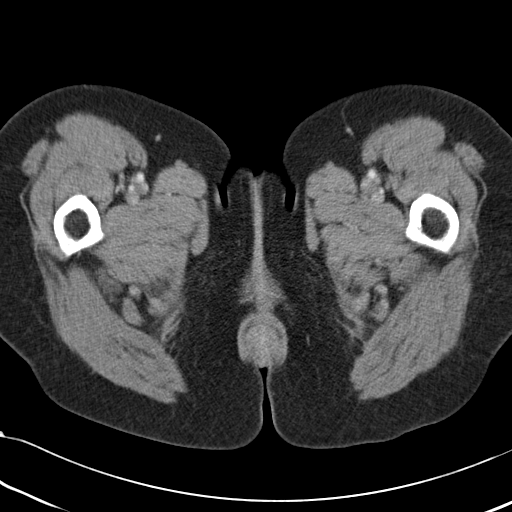
[im 4/51  bone]
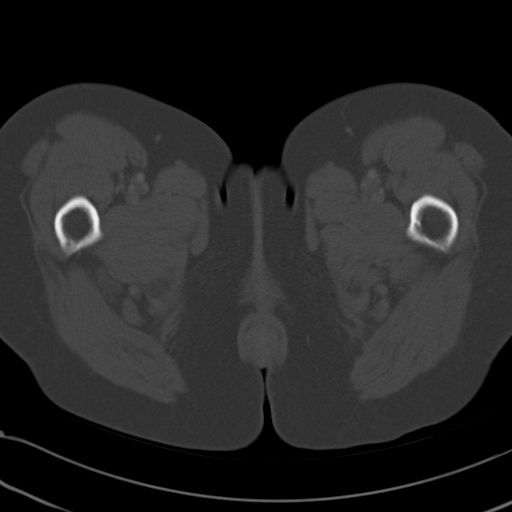
[im 7/51  soft-tissue]
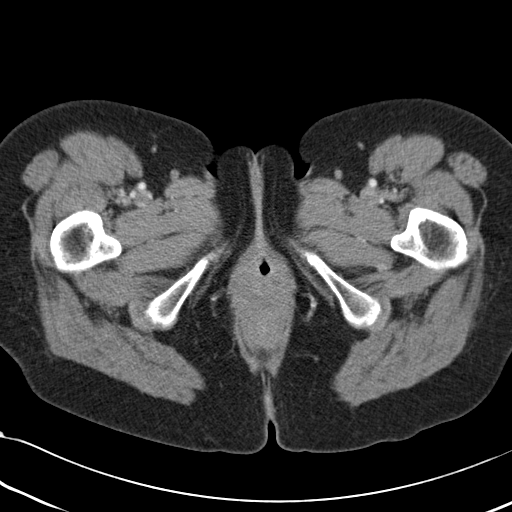
[im 11/51  soft-tissue]
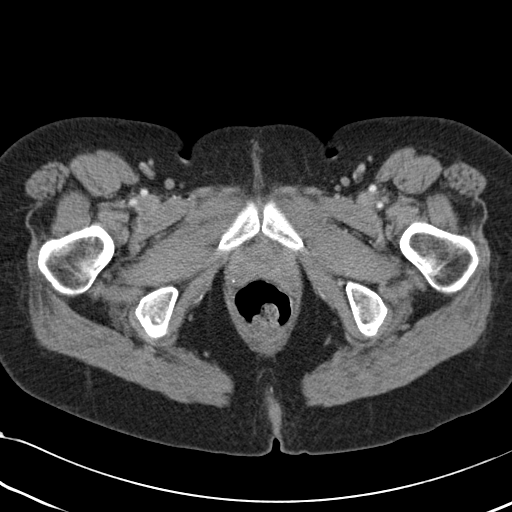
[im 14/51  soft-tissue]
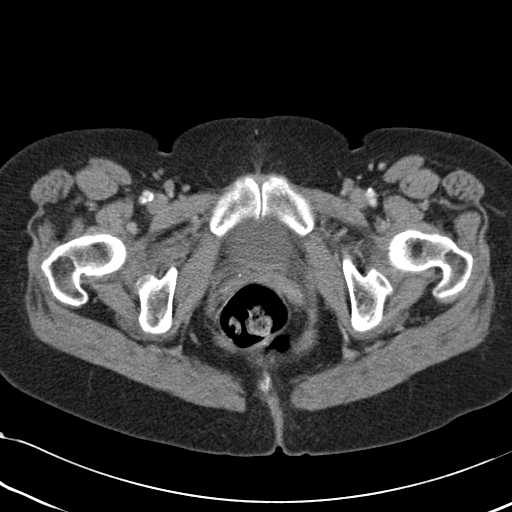
[im 17/51  soft-tissue]
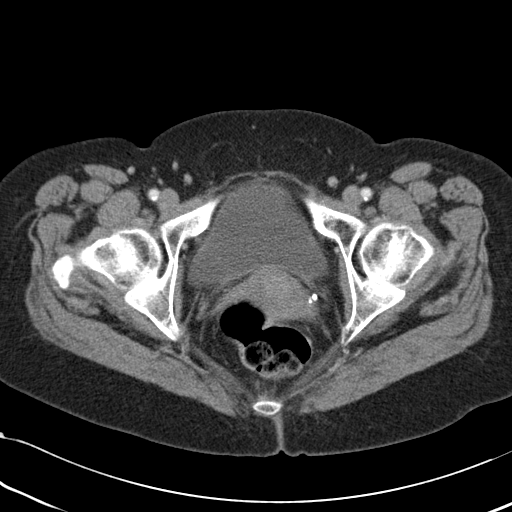
[im 21/51  soft-tissue]
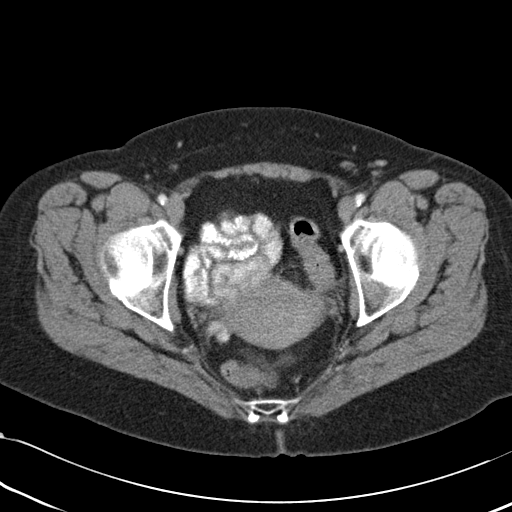
[im 24/51  soft-tissue]
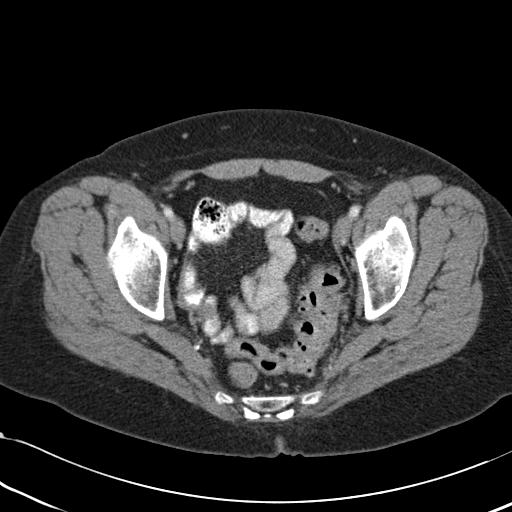
[im 27/51  soft-tissue]
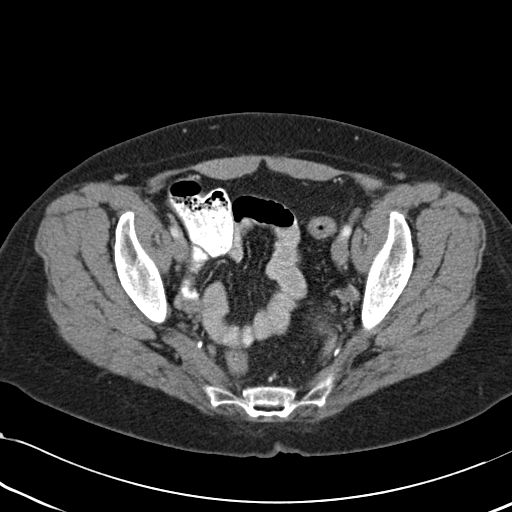
[im 31/51  soft-tissue]
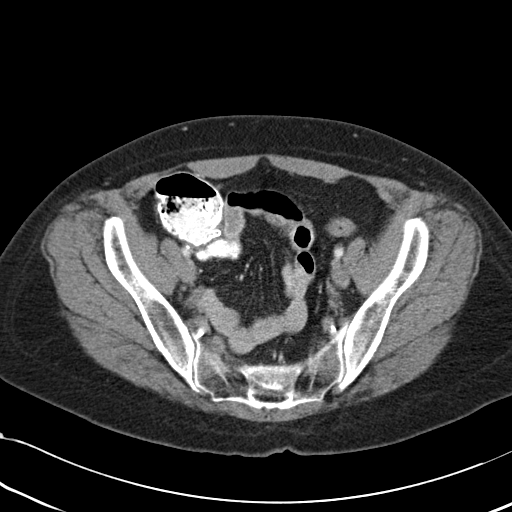
[im 31/51  bone]
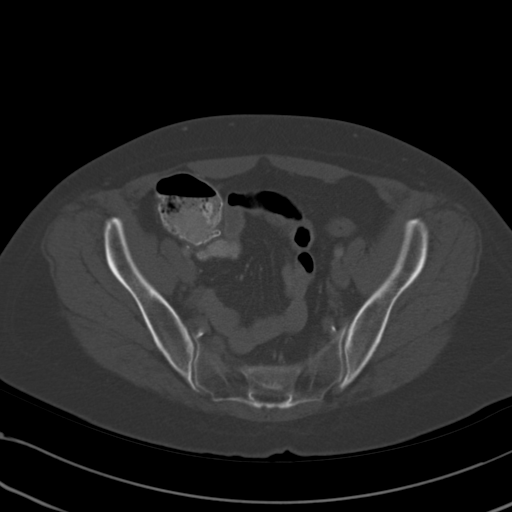
[im 34/51  soft-tissue]
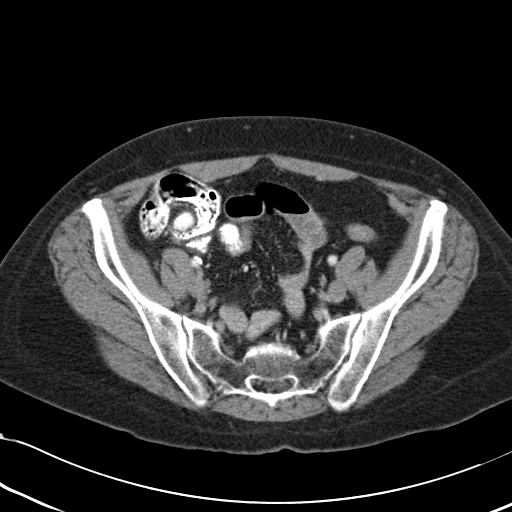
[im 37/51  soft-tissue]
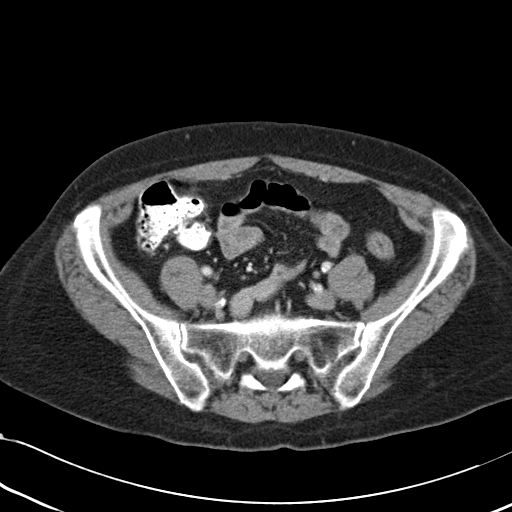
[im 41/51  soft-tissue]
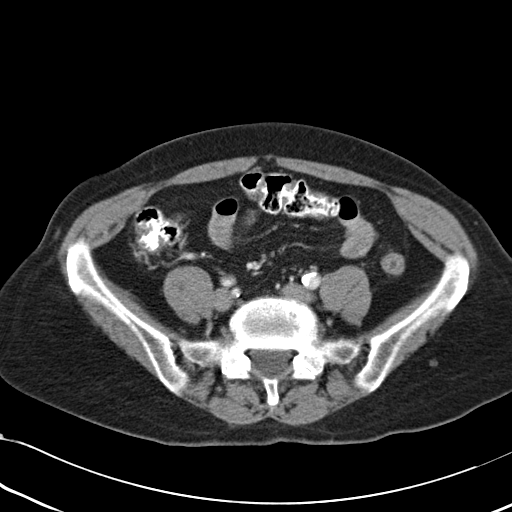
[im 44/51  soft-tissue]
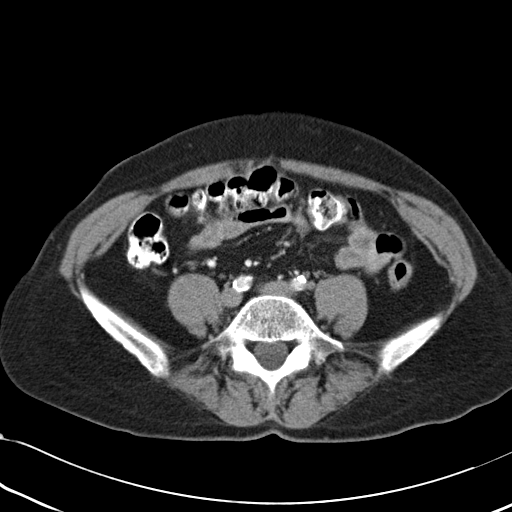
[im 47/51  soft-tissue]
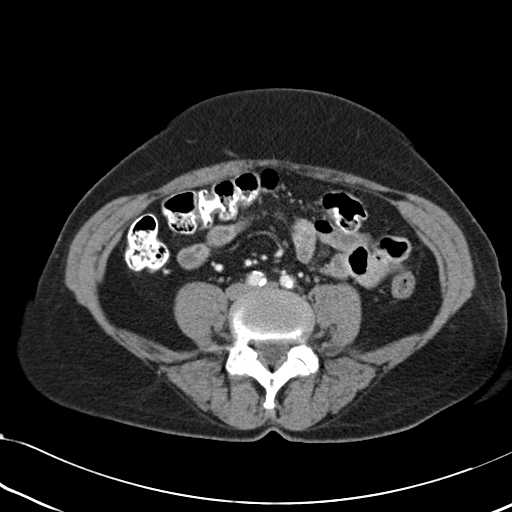

[Series 3: coronal · coronal · 0.54mm/px · 3 of 46 slices shown]
[im 16/46  soft-tissue]
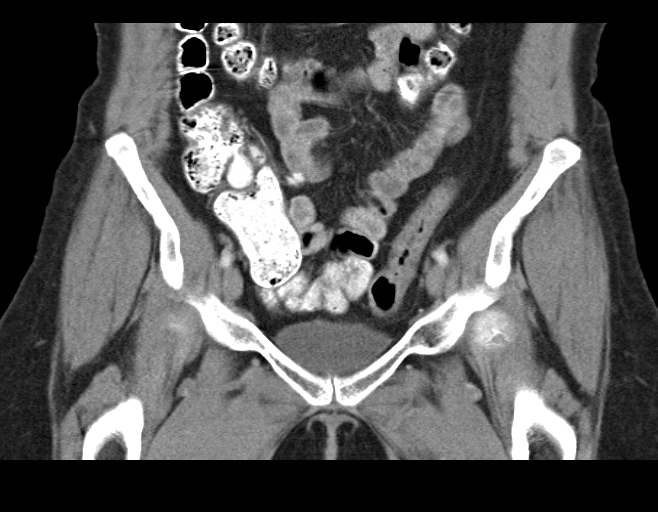
[im 21/46  soft-tissue]
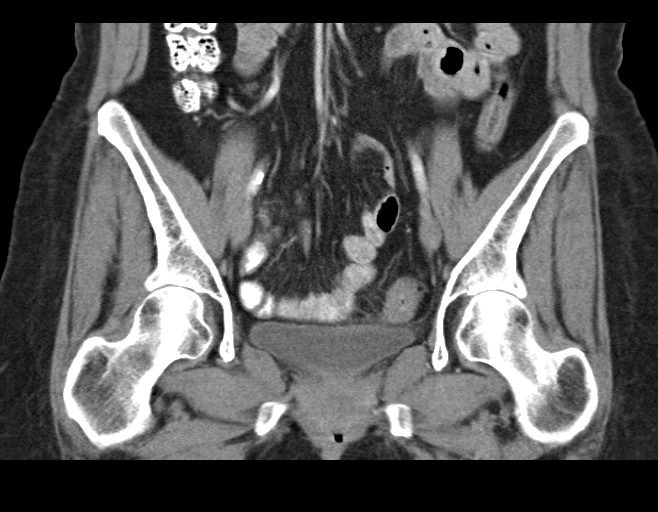
[im 26/46  soft-tissue]
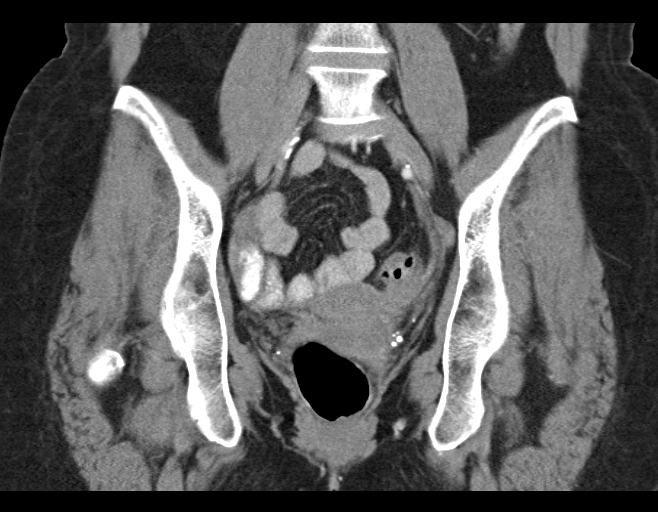

[17 of 46 positions shown; findings below may reference images not displayed]

FINDINGS: Urinary Tract: Visualized ureters are decompressed. Urinary bladder
unremarkable.

Bowel: Sigmoid diverticulosis. No active diverticulitis. Visualized
small bowel decompressed and grossly unremarkable.

Vascular/Lymphatic: Distal aortic and iliac calcifications. No
aneurysm. No visible adenopathy.

Reproductive: Uterus and adnexa unremarkable.  No mass.

Other: No free fluid or free air.

Musculoskeletal: No acute bony abnormality or focal bone lesion.
IMPRESSION: Sigmoid diverticulosis.  No active diverticulitis.

No acute findings.

Aorta iliac atherosclerosis.

## 2016-06-26 ENCOUNTER — Encounter: Payer: Self-pay | Admitting: Family Medicine

## 2016-06-26 ENCOUNTER — Ambulatory Visit (INDEPENDENT_AMBULATORY_CARE_PROVIDER_SITE_OTHER): Payer: BLUE CROSS/BLUE SHIELD | Admitting: Family Medicine

## 2016-06-26 VITALS — BP 160/100 | HR 68 | Temp 97.6°F | Resp 16 | Ht 64.0 in | Wt 143.0 lb

## 2016-06-26 DIAGNOSIS — E7800 Pure hypercholesterolemia, unspecified: Secondary | ICD-10-CM | POA: Diagnosis not present

## 2016-06-26 DIAGNOSIS — I10 Essential (primary) hypertension: Secondary | ICD-10-CM

## 2016-06-26 LAB — CBC WITH DIFFERENTIAL/PLATELET
BASOS PCT: 1 %
Basophils Absolute: 63 cells/uL (ref 0–200)
EOS ABS: 252 {cells}/uL (ref 15–500)
Eosinophils Relative: 4 %
HEMATOCRIT: 37.1 % (ref 35.0–45.0)
Hemoglobin: 12.3 g/dL (ref 12.0–15.0)
Lymphocytes Relative: 33 %
Lymphs Abs: 2079 cells/uL (ref 850–3900)
MCH: 29.6 pg (ref 27.0–33.0)
MCHC: 33.2 g/dL (ref 32.0–36.0)
MCV: 89.2 fL (ref 80.0–100.0)
MONO ABS: 441 {cells}/uL (ref 200–950)
MPV: 9.8 fL (ref 7.5–12.5)
Monocytes Relative: 7 %
NEUTROS ABS: 3465 {cells}/uL (ref 1500–7800)
Neutrophils Relative %: 55 %
Platelets: 267 10*3/uL (ref 140–400)
RBC: 4.16 MIL/uL (ref 3.80–5.10)
RDW: 14.3 % (ref 11.0–15.0)
WBC: 6.3 10*3/uL (ref 3.8–10.8)

## 2016-06-26 MED ORDER — ALPRAZOLAM 0.5 MG PO TABS: ORAL_TABLET | ORAL | 2 refills | Status: DC

## 2016-06-26 NOTE — Progress Notes (Signed)
Subjective:    Patient ID: Lauren Harrison, female    DOB: 14-Feb-1955, 61 y.o.   MRN: [id number]  HPI  03/2016 Patient is here today to get a refill on her Xanax. She is a very sweet 61 year old female who takes Xanax sparingly to help her sleep at night. At most she takes One-A-Day. There is no evidence of abuse or diversion. However I'm very concerned by her blood pressure and she continues to smoke. I am very concerned about this causing heart disease in the future. I recommended adding something else to her blood pressure however she states that in the past we did this it dropped her blood pressure too much and therefore she is afraid to do this. After a long discussion she is willing to consider smoking cessation in trying Chantix again. At the present time she smokes a pack of cigarettes per day.  At that time, my plan was: Patient's blood pressure significantly elevated. She will check her blood pressure everyday and notify me in a week with the values are. If they're greater than 140/90, I will add medication to lower this down and she is willing to do that if that is in fact the case. After a long discussion, she is willing to try Chantix. Therefore I gave her a prescription for a starter pack. I gladly refill her Xanax. Recheck blood pressure in one week  06/26/16 She is due to recheck her cholesterol. She is tolerating the Lipitor without difficulty. She denies any myalgias or right upper quadrant pain.  Her blood pressure today is extremely high. She states that she checks her blood pressure at home is 120/70. However is been high the last 2 times and I'm concerned that we may be overlooking a significant problem. Is also possible that she has white coat syndrome. She agrees check her blood pressure everyday over the weekend, record the values, and then bring them to me for me to review. She cannot tolerate Chantix but she is trying to quit smoking on her own. Past Medical History:  Diagnosis  Date  . Anemia   . Anxiety   . Arthritis   . Full dentures   . HLD (hyperlipidemia)   . Hypertension   . Insomnia   . Smoker    Past Surgical History:  Procedure Laterality Date  . APPENDECTOMY    . [place]   br bx lt  . BREAST LUMPECTOMY WITH RADIOACTIVE SEED LOCALIZATION Left 01/05/2014   Procedure: LEFT BREAST LUMPECTOMY WITH RADIOACTIVE SEED LOCALIZATION;  Surgeon: Fanny Skates, MD;  Location: [facility];  Service: General;  Laterality: Left;  . TUBAL LIGATION     Current Outpatient Prescriptions on File Prior to Visit  Medication Sig Dispense Refill  . atorvastatin (LIPITOR) 20 MG tablet Take 1 tablet (20 mg total) by mouth daily. 90 tablet 3  . losartan (COZAAR) 100 MG tablet Take 1 tablet (100 mg total) by mouth daily. 90 tablet 3   No current facility-administered medications on file prior to visit.    No Known Allergies Social History   Social History  . Marital status: Married    Spouse name: N/A  . Number of children: N/A  . Years of education: N/A   Occupational History  . Not on file.   Social History Main Topics  . Smoking status: Current Every Day Smoker    Packs/day: 1.00  . Smokeless tobacco: Never Used  . Alcohol use Yes  Comment: Occasional  . Drug use: No  . Sexual activity: Yes    Birth control/ protection: None   Other Topics Concern  . Not on file   Social History Narrative  . No narrative on file      Review of Systems  All other systems reviewed and are negative.      Objective:   Physical Exam  Constitutional: She is oriented to person, place, and time. She appears well-developed and well-nourished.  Neck: No JVD present.  Cardiovascular: Normal rate, regular rhythm and normal heart sounds.   No murmur heard. Pulmonary/Chest: Effort normal and breath sounds normal. No respiratory distress. She has no wheezes. She has no rales.  Abdominal: Soft. Bowel sounds are normal. She exhibits no  distension. There is no tenderness. There is no rebound.  Musculoskeletal: She exhibits no edema.  Lymphadenopathy:    She has no cervical adenopathy.  Neurological: She is alert and oriented to person, place, and time. She has normal reflexes. She displays normal reflexes. No cranial nerve deficit. She exhibits normal muscle tone. Coordination normal.  Vitals reviewed.         Assessment & Plan:  Pure hypercholesterolemia - Plan: CBC with Differential/Platelet, COMPLETE METABOLIC PANEL WITH GFR, Lipid panel  Benign essential HTN  Check fasting lipid panel. Goal LDL cholesterol is less than 100. Blood pressure is extremely high. Patient will check her blood pressure 2 times a day over the weekend and record the values for me to see next week. If blood pressures greater than 140/90, I would add hydrochlorothiazide. She also reports tingling in her right arm. This happened on 2 separate occasions. It last just seconds and resolve spontaneously. It seems to be related to position that she is holding her arm while she is sitting on the couch. I do not believe that this represents any type of cardiovascular problem but rather likely represents some type of nerve impingement or the arm could simply be falling asleep based on position.

## 2016-06-27 LAB — COMPLETE METABOLIC PANEL WITH GFR
ALK PHOS: 106 U/L (ref 33–130)
ALT: 23 U/L (ref 6–29)
AST: 20 U/L (ref 10–35)
Albumin: 4.1 g/dL (ref 3.6–5.1)
BILIRUBIN TOTAL: 0.5 mg/dL (ref 0.2–1.2)
BUN: 9 mg/dL (ref 7–25)
CALCIUM: 9.3 mg/dL (ref 8.6–10.4)
CO2: 25 mmol/L (ref 20–31)
CREATININE: 0.77 mg/dL (ref 0.50–0.99)
Chloride: 105 mmol/L (ref 98–110)
GFR, Est Non African American: 84 mL/min (ref >=60)
Glucose, Bld: 95 mg/dL (ref 70–99)
Potassium: 4.5 mmol/L (ref 3.5–5.3)
Sodium: 138 mmol/L (ref 135–146)
TOTAL PROTEIN: 6.4 g/dL (ref 6.1–8.1)

## 2016-06-27 LAB — LIPID PANEL
CHOLESTEROL: 112 mg/dL (ref <200)
HDL: 32 mg/dL — ABNORMAL LOW (ref >50)
LDL Cholesterol: 51 mg/dL (ref <100)
TRIGLYCERIDES: 147 mg/dL (ref <150)
Total CHOL/HDL Ratio: 3.5 Ratio (ref <5.0)
VLDL: 29 mg/dL (ref <30)

## 2016-07-08 ENCOUNTER — Other Ambulatory Visit: Payer: Self-pay | Admitting: Family Medicine

## 2016-09-11 ENCOUNTER — Telehealth: Payer: Self-pay | Admitting: Family Medicine

## 2016-09-11 NOTE — Telephone Encounter (Signed)
Referral to breast center for mammogram

## 2016-09-18 NOTE — Telephone Encounter (Signed)
Pt given number to the [facility].  She can call and schedule mammogram herself.  If she has problem, just let me know

## 2016-09-28 ENCOUNTER — Other Ambulatory Visit: Payer: Self-pay | Admitting: Family Medicine

## 2016-09-28 NOTE — Telephone Encounter (Signed)
Ok to refill 

## 2016-09-29 NOTE — Telephone Encounter (Signed)
ok 

## 2016-09-29 NOTE — Telephone Encounter (Signed)
Medication called to pharmacy. 

## 2016-10-02 ENCOUNTER — Other Ambulatory Visit: Payer: Self-pay | Admitting: Family Medicine

## 2016-10-17 ENCOUNTER — Emergency Department (HOSPITAL_COMMUNITY)
Admission: EM | Admit: 2016-10-17 | Discharge: 2016-10-17 | Disposition: A | Discharge status: Home / Self Care | Payer: BLUE CROSS/BLUE SHIELD | Attending: Emergency Medicine | Admitting: Emergency Medicine

## 2016-10-17 ENCOUNTER — Encounter (HOSPITAL_COMMUNITY): Payer: Self-pay | Admitting: Emergency Medicine

## 2016-10-17 DIAGNOSIS — I10 Essential (primary) hypertension: Secondary | ICD-10-CM | POA: Insufficient documentation

## 2016-10-17 DIAGNOSIS — Z79899 Other long term (current) drug therapy: Secondary | ICD-10-CM | POA: Diagnosis not present

## 2016-10-17 DIAGNOSIS — F17210 Nicotine dependence, cigarettes, uncomplicated: Secondary | ICD-10-CM | POA: Insufficient documentation

## 2016-10-17 DIAGNOSIS — M79602 Pain in left arm: Secondary | ICD-10-CM | POA: Diagnosis present

## 2016-10-17 LAB — CBC WITH DIFFERENTIAL/PLATELET
BASOS PCT: 0 %
Basophils Absolute: 0 10*3/uL (ref 0.0–0.1)
EOS PCT: 1 %
Eosinophils Absolute: 0.1 10*3/uL (ref 0.0–0.7)
HCT: 33.2 % — ABNORMAL LOW (ref 36.0–46.0)
Hemoglobin: 10.8 g/dL — ABNORMAL LOW (ref 12.0–15.0)
LYMPHS ABS: 3.1 10*3/uL (ref 0.7–4.0)
Lymphocytes Relative: 34 %
MCH: 29.6 pg (ref 26.0–34.0)
MCHC: 32.5 g/dL (ref 30.0–36.0)
MCV: 91 fL (ref 78.0–100.0)
MONO ABS: 0.6 10*3/uL (ref 0.1–1.0)
MONOS PCT: 7 %
Neutro Abs: 5.4 10*3/uL (ref 1.7–7.7)
Neutrophils Relative %: 58 %
PLATELETS: 254 10*3/uL (ref 150–400)
RBC: 3.65 MIL/uL — ABNORMAL LOW (ref 3.87–5.11)
RDW: 13.6 % (ref 11.5–15.5)
WBC: 9.3 10*3/uL (ref 4.0–10.5)

## 2016-10-17 LAB — BASIC METABOLIC PANEL
Anion gap: 8 (ref 5–15)
BUN: 11 mg/dL (ref 6–20)
CALCIUM: 9.3 mg/dL (ref 8.9–10.3)
CHLORIDE: 105 mmol/L (ref 101–111)
CO2: 27 mmol/L (ref 22–32)
CREATININE: 0.69 mg/dL (ref 0.44–1.00)
GFR calc Af Amer: >60 mL/min (ref >60)
GFR calc non Af Amer: >60 mL/min (ref >60)
Glucose, Bld: 105 mg/dL — ABNORMAL HIGH (ref 65–99)
Potassium: 3.5 mmol/L (ref 3.5–5.1)
SODIUM: 140 mmol/L (ref 135–145)

## 2016-10-17 LAB — I-STAT TROPONIN, ED: TROPONIN I, POC: 0 ng/mL (ref 0.00–0.08)

## 2016-10-17 MED ORDER — AMLODIPINE BESYLATE 5 MG PO TABS
10.0000 mg | ORAL_TABLET | Freq: Once | ORAL | Status: AC
  Administered 2016-10-17: 10 mg via ORAL
  Filled 2016-10-17: qty 2

## 2016-10-17 MED ORDER — AMLODIPINE BESYLATE 5 MG PO TABS: 5.0000 mg | ORAL_TABLET | Freq: Every day | ORAL | 0 refills | Status: DC

## 2016-10-17 MED ORDER — ASPIRIN 81 MG PO CHEW
324.0000 mg | CHEWABLE_TABLET | Freq: Once | ORAL | Status: AC
  Administered 2016-10-17: 324 mg via ORAL
  Filled 2016-10-17: qty 4

## 2016-10-17 NOTE — ED Provider Notes (Signed)
[facility] DEPT Provider Note   CSN: [id number] Arrival date & time: 10/17/16  1819     History   Chief Complaint Chief Complaint  Patient presents with  . Hypertension    HPI Lauren Harrison is a 61 y.o. female.:  Complaint is arm pain, and high blood pressure  HPI:  This is a 55-year-old female with history of hypertension. No history of heart disease. 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 Her left arm became sore yesterday and sore again today at work. Hurts to lift it over her head. It is more in the muscle of her upper arm than her shoulder itself. She states she became concerned because she checked her blood pressure and it was high. She states it has been high over the last several weeks whenever she checks it. She was told it was supposed to be less than 140/70 and it is typically 150-160/90-110. No difficulty breathing. No shortness of breath. No peripheral edema. She does smoke.  Past Medical History:  Diagnosis Date  . Anemia   . Anxiety   . Arthritis   . Full dentures   . HLD (hyperlipidemia)   . Hypertension   . Insomnia   . Smoker     Patient Active Problem List   Diagnosis Date Noted  . Abnormal mammogram of left breast 01/05/2014  . Smoker   . Insomnia     Past Surgical History:  Procedure Laterality Date  . APPENDECTOMY    . BREAST LUMPECTOMY WITH RADIOACTIVE SEED LOCALIZATION Left 01/05/2014   Procedure: LEFT BREAST LUMPECTOMY WITH RADIOACTIVE SEED LOCALIZATION;  Surgeon: Fanny Skates, MD;  Location: [facility];  Service: General;  Laterality: Left;  . TUBAL LIGATION      OB History    Gravida Para Term Preterm AB Living   1         1   SAB TAB Ectopic Multiple Live Births                   Home Medications    Prior to Admission medications   Medication Sig Start Date End Date Taking? Authorizing Provider  acetaminophen (TYLENOL) 500 MG tablet Take 500 mg by mouth every 6 (six) hours as needed for mild pain.   Yes  [provider]  ALPRAZolam (XANAX) 0.5 MG tablet TAKE 1 TABLET BY MOUTH EVERY NIGHT AT BEDTIME AS NEEDED FOR INSOMNIA Patient taking differently: TAKE 1 TABLET BY MOUTH EVERY NIGHT AT BEDTIME 09/29/16  Yes Trion, Modena Nunnery, MD  atorvastatin (LIPITOR) 20 MG tablet Take 1 tablet (20 mg total) by mouth daily. 03/16/16  Yes Susy Frizzle, MD  losartan (COZAAR) 100 MG tablet TAKE 1 TABLET BY MOUTH DAILY 10/02/16  Yes Susy Frizzle, MD  amLODipine (NORVASC) 5 MG tablet Take 1 tablet (5 mg total) by mouth daily. 10/17/16   Tanna Furry, MD    Family History Family History  Problem Relation Age of Onset  . Cancer Father   . Heart disease Sister   . Stroke Mother   . Heart disease Mother     Social History Social History  Substance Use Topics  . Smoking status: Current Every Day Smoker    Packs/day: 1.00    Types: Cigarettes  . Smokeless tobacco: Never Used  . Alcohol use Yes     Comment: Occasional     Allergies   Patient has no allergy information on record.   Review of Systems Review of Systems  Constitutional: Negative for appetite change, chills, diaphoresis, fatigue and fever.  HENT: Negative for mouth sores, sore throat and trouble swallowing.   Eyes: Negative for visual disturbance.  Respiratory: Negative for cough, chest tightness, shortness of breath and wheezing.   Cardiovascular: Negative for chest pain.  Gastrointestinal: Negative for abdominal distention, abdominal pain, diarrhea, nausea and vomiting.  Endocrine: Negative for polydipsia, polyphagia and polyuria.  Genitourinary: Negative for dysuria, frequency and hematuria.  Musculoskeletal: Positive for arthralgias and myalgias. Negative for gait problem.  Skin: Negative for color change, pallor and rash.  Neurological: Negative for dizziness, syncope, light-headedness and headaches.  Hematological: Does not bruise/bleed easily.  Psychiatric/Behavioral: Negative for behavioral problems and confusion.      Physical Exam Updated Vital Signs BP (!) 143/78   Pulse (!) 57   Temp 97.7 F (36.5 C) (Oral) Comment: Simultaneous filing. User may not have seen previous data. Comment (Src): Simultaneous filing. User may not have seen previous data.  Resp (!) 21   Ht 5\' 4"  (1.626 m)   Wt 63.5 kg (140 lb)   SpO2 93%   BMI 24.03 kg/m   Physical Exam  Constitutional: She is oriented to person, place, and time. She appears well-developed and well-nourished. No distress.  HENT:  Head: Normocephalic.  Eyes: Pupils are equal, round, and reactive to light. Conjunctivae are normal. No scleral icterus.  Neck: Normal range of motion. Neck supple. No thyromegaly present.  Cardiovascular: Normal rate and regular rhythm.  Exam reveals no gallop and no friction rub.   No murmur heard. Pulmonary/Chest: Effort normal and breath sounds normal. No respiratory distress. She has no wheezes. She has no rales.  Abdominal: Soft. Bowel sounds are normal. She exhibits no distension. There is no tenderness. There is no rebound.  Musculoskeletal: Normal range of motion.  Normal range of motion of the shoulder. Tender palpating the musculature of the deltoid and also insertion. She does not have radial nerve signs. Normal neurovascular function distally.  Neurological: She is alert and oriented to person, place, and time.  Skin: Skin is warm and dry. No rash noted.  Psychiatric: She has a normal mood and affect. Her behavior is normal.     ED Treatments / Results  Labs (all labs ordered are listed, but only abnormal results are displayed) Labs Reviewed  CBC WITH DIFFERENTIAL/PLATELET - Abnormal; Notable for the following:       Result Value   RBC 3.65 (*)    Hemoglobin 10.8 (*)    HCT 33.2 (*)    All other components within normal limits  BASIC METABOLIC PANEL - Abnormal; Notable for the following:    Glucose, Bld 105 (*)    All other components within normal limits  I-STAT TROPONIN, ED    EKG  EKG  Interpretation None       Radiology No results found.  Procedures Procedures (including critical care time)  Medications Ordered in ED Medications  amLODipine (NORVASC) tablet 10 mg (10 mg Oral Given 10/17/16 1945)  aspirin chewable tablet 324 mg (324 mg Oral Given 10/17/16 1946)     Initial Impression / Assessment and Plan / ED Course  I have reviewed the triage vital signs and the nursing notes.  Pertinent labs & imaging results that were available during my care of the patient were reviewed by me and considered in my medical decision making (see chart for details).   given amlodipine by mouth. Blood pressure improves nicely. EKG normal. Troponin normal. CBC basically normal. Have advised  her to stop smoking. Weight loss, low-fat diet, salt restriction. Primary care follow-up. Check and record blood pressures daily.  Final Clinical Impressions(s) / ED Diagnoses   Final diagnoses:  Hypertension, unspecified type    New Prescriptions New Prescriptions   AMLODIPINE (NORVASC) 5 MG TABLET    Take 1 tablet (5 mg total) by mouth daily.     Tanna Furry, MD 10/17/16 [phone]

## 2016-10-17 NOTE — Discharge Instructions (Signed)
Take amlodipine daily. Continue losartan. Follow-up with her primary care physician within one to 2 weeks. Check and record your blood pressure once per day only

## 2016-10-17 NOTE — ED Triage Notes (Signed)
Pt states having high blood pressure. BP 151/96 in triage. Pt also c/o of left shoulder pain with movement. NAD

## 2016-10-23 ENCOUNTER — Encounter: Payer: Self-pay | Admitting: Family Medicine

## 2016-10-23 ENCOUNTER — Ambulatory Visit (INDEPENDENT_AMBULATORY_CARE_PROVIDER_SITE_OTHER): Payer: BLUE CROSS/BLUE SHIELD | Admitting: Family Medicine

## 2016-10-23 VITALS — BP 170/98 | HR 72 | Temp 98.4°F | Resp 20 | Wt 141.0 lb

## 2016-10-23 DIAGNOSIS — I10 Essential (primary) hypertension: Secondary | ICD-10-CM | POA: Diagnosis not present

## 2016-10-23 DIAGNOSIS — Z23 Encounter for immunization: Secondary | ICD-10-CM | POA: Diagnosis not present

## 2016-10-23 MED ORDER — LOSARTAN POTASSIUM-HCTZ 100-25 MG PO TABS: 1.0000 | ORAL_TABLET | Freq: Every day | ORAL | 3 refills | Status: DC

## 2016-10-23 NOTE — Progress Notes (Signed)
   Subjective:    Patient ID: Lauren Harrison, female    DOB: 05-Oct-1955, 61 y.o.   MRN: [id number]  HPI Patient has a history of hypertension. Recently went to the emergency room due to elevated blood pressures and was started on amlodipine 5 mg a day in addition to the losartan 100 mg a day she's been taking. Her blood pressures at home despite being on this medication remain elevated between 329 and 924 systolic. Here today her blood pressure is even higher although I believe she has an element of white coat syndrome Past Medical History:  Diagnosis Date  . Anemia   . Anxiety   . Arthritis   . Full dentures   . HLD (hyperlipidemia)   . Hypertension   . Insomnia   . Smoker    Past Surgical History:  Procedure Laterality Date  . APPENDECTOMY    . BREAST LUMPECTOMY WITH RADIOACTIVE SEED LOCALIZATION Left 01/05/2014   Procedure: LEFT BREAST LUMPECTOMY WITH RADIOACTIVE SEED LOCALIZATION;  Surgeon: Fanny Skates, MD;  Location: [facility];  Service: General;  Laterality: Left;  . TUBAL LIGATION     Current Outpatient Prescriptions on File Prior to Visit  Medication Sig Dispense Refill  . acetaminophen (TYLENOL) 500 MG tablet Take 500 mg by mouth every 6 (six) hours as needed for mild pain.    Marland Kitchen ALPRAZolam (XANAX) 0.5 MG tablet TAKE 1 TABLET BY MOUTH EVERY NIGHT AT BEDTIME AS NEEDED FOR INSOMNIA 30 tablet 1  . amLODipine (NORVASC) 5 MG tablet Take 1 tablet (5 mg total) by mouth daily. 30 tablet 0  . atorvastatin (LIPITOR) 20 MG tablet Take 1 tablet (20 mg total) by mouth daily. 90 tablet 3  . losartan (COZAAR) 100 MG tablet TAKE 1 TABLET BY MOUTH DAILY 90 tablet 3   No current facility-administered medications on file prior to visit.    Not on File Social History   Social History  . Marital status: Married    Spouse name: N/A  . Number of children: N/A  . Years of education: N/A   Occupational History  . Not on file.   Social History Main Topics  . Smoking  status: Current Every Day Smoker    Packs/day: 1.00    Types: Cigarettes  . Smokeless tobacco: Never Used  . Alcohol use Yes     Comment: Occasional  . Drug use: No  . Sexual activity: Yes    Birth control/ protection: None   Other Topics Concern  . Not on file   Social History Narrative  . No narrative on file      Review of Systems  All other systems reviewed and are negative.      Objective:   Physical Exam  Cardiovascular: Normal rate, regular rhythm and normal heart sounds.   Pulmonary/Chest: Effort normal and breath sounds normal. No respiratory distress. She has no wheezes. She has no rales.  Abdominal: Soft. Bowel sounds are normal. She exhibits no distension. There is no tenderness. There is no rebound.  Musculoskeletal: She exhibits no edema.  Vitals reviewed.         Assessment & Plan:  Benign essential HTN Continue amlodipine 5 mg a day but change losartan to Hyzaar 100/25 one by mouth daily and recheck blood pressure here in 3 weeks

## 2016-10-23 NOTE — Addendum Note (Signed)
Addended by: Olena Mater on: 10/23/2016 09:42 AM   Modules accepted: Orders

## 2016-11-16 ENCOUNTER — Ambulatory Visit: Payer: BLUE CROSS/BLUE SHIELD | Admitting: Family Medicine

## 2016-11-19 ENCOUNTER — Ambulatory Visit (INDEPENDENT_AMBULATORY_CARE_PROVIDER_SITE_OTHER): Payer: BLUE CROSS/BLUE SHIELD | Admitting: Family Medicine

## 2016-11-19 ENCOUNTER — Encounter: Payer: Self-pay | Admitting: Family Medicine

## 2016-11-19 VITALS — BP 128/80 | HR 88 | Temp 97.4°F | Resp 16 | Ht 64.0 in | Wt 142.0 lb

## 2016-11-19 DIAGNOSIS — L02224 Furuncle of groin: Secondary | ICD-10-CM

## 2016-11-19 MED ORDER — SULFAMETHOXAZOLE-TRIMETHOPRIM 800-160 MG PO TABS: 1.0000 | ORAL_TABLET | Freq: Two times a day (BID) | ORAL | 0 refills | Status: DC

## 2016-11-19 MED ORDER — ALPRAZOLAM 0.5 MG PO TABS: ORAL_TABLET | ORAL | 1 refills | Status: DC

## 2016-11-19 NOTE — Addendum Note (Signed)
Addended by: Shary Decamp B on: 11/19/2016 10:36 AM   Modules accepted: Orders

## 2016-11-19 NOTE — Progress Notes (Signed)
   Subjective:    Patient ID: Lauren Harrison, female    DOB: 1955-02-08, 61 y.o.   MRN: [id number]  HPI Patient reports an enlarging boil on her left labia 1 week It is extremely erythematous hot painful and swollen. It is 2 cm in diameter. She is here today for treatment. Past Medical History:  Diagnosis Date  . Anemia   . Anxiety   . Arthritis   . Full dentures   . HLD (hyperlipidemia)   . Hypertension   . Insomnia   . Smoker    Past Surgical History:  Procedure Laterality Date  . APPENDECTOMY    . BREAST LUMPECTOMY WITH RADIOACTIVE SEED LOCALIZATION Left 01/05/2014   Procedure: LEFT BREAST LUMPECTOMY WITH RADIOACTIVE SEED LOCALIZATION;  Surgeon: Fanny Skates, MD;  Location: [facility];  Service: General;  Laterality: Left;  . TUBAL LIGATION     Current Outpatient Prescriptions on File Prior to Visit  Medication Sig Dispense Refill  . acetaminophen (TYLENOL) 500 MG tablet Take 500 mg by mouth every 6 (six) hours as needed for mild pain.    Marland Kitchen atorvastatin (LIPITOR) 20 MG tablet Take 1 tablet (20 mg total) by mouth daily. 90 tablet 3  . losartan-hydrochlorothiazide (HYZAAR) 100-25 MG tablet Take 1 tablet by mouth daily. This replaces losartan 90 tablet 3  . amLODipine (NORVASC) 5 MG tablet Take 1 tablet (5 mg total) by mouth daily. (Patient not taking: Reported on 11/19/2016) 30 tablet 0   No current facility-administered medications on file prior to visit.    No Known Allergies Social History   Social History  . Marital status: Married    Spouse name: N/A  . Number of children: N/A  . Years of education: N/A   Occupational History  . Not on file.   Social History Main Topics  . Smoking status: Current Every Day Smoker    Packs/day: 1.00    Types: Cigarettes  . Smokeless tobacco: Never Used  . Alcohol use Yes     Comment: Occasional  . Drug use: No  . Sexual activity: Yes    Birth control/ protection: None   Other Topics Concern  . Not on file     Social History Narrative  . No narrative on file      Review of Systems  All other systems reviewed and are negative.      Objective:   Physical Exam  Cardiovascular: Normal rate, regular rhythm and normal heart sounds.   Pulmonary/Chest: Effort normal and breath sounds normal. No respiratory distress. She has no wheezes. She has no rales.  Genitourinary:     Vitals reviewed.         Assessment & Plan:  Boil of groin - Plan: sulfamethoxazole-trimethoprim (BACTRIM DS,SEPTRA DS) 800-160 MG tablet, CULTURE, ROUTINE-ABSCESS, DISCONTINUED: sulfamethoxazole-trimethoprim (BACTRIM DS,SEPTRA DS) 800-160 MG tablet  Area was anesthetized with 0.1% lidocaine without epinephrine. A 1 cm vertical incision was made into the abscess and copious purulent material was drained from the abscess. A wound culture was sent. The wound was then packed with 3 inches of one quarter-inch iodoform gauze. Wound care was discussed. Begin Bactrim double strength tablets by mouth twice a day for 7 days and recheck next week if no better or sooner if worse

## 2016-11-22 LAB — CULTURE, ROUTINE-GENITAL
MICRO NUMBER:: 81168486
RESULT:: NORMAL
SPECIMEN QUALITY: ADEQUATE

## 2016-11-23 ENCOUNTER — Ambulatory Visit: Payer: BLUE CROSS/BLUE SHIELD | Admitting: Family Medicine

## 2016-11-23 ENCOUNTER — Encounter: Payer: Self-pay | Admitting: Family Medicine

## 2016-11-23 ENCOUNTER — Ambulatory Visit (INDEPENDENT_AMBULATORY_CARE_PROVIDER_SITE_OTHER): Payer: BLUE CROSS/BLUE SHIELD | Admitting: Family Medicine

## 2016-11-23 VITALS — BP 132/90 | HR 60 | Temp 97.5°F | Resp 16 | Ht 64.0 in | Wt 141.0 lb

## 2016-11-23 DIAGNOSIS — R112 Nausea with vomiting, unspecified: Secondary | ICD-10-CM

## 2016-11-23 MED ORDER — ONDANSETRON HCL 4 MG PO TABS: 4.0000 mg | ORAL_TABLET | Freq: Three times a day (TID) | ORAL | 0 refills | Status: DC | PRN

## 2016-11-23 NOTE — Progress Notes (Signed)
Subjective:    Patient ID: Lauren Harrison, female    DOB: Sep 11, 1955, 61 y.o.   MRN: [id number]  HPI  11/19/16 Patient reports an enlarging boil on her left labia 1 week It is extremely erythematous hot painful and swollen. It is 2 cm in diameter. She is here today for treatment.  At that time, my plan was: Area was anesthetized with 0.1% lidocaine without epinephrine. A 1 cm vertical incision was made into the abscess and copious purulent material was drained from the abscess. A wound culture was sent. The wound was then packed with 3 inches of one quarter-inch iodoform gauze. Wound care was discussed. Begin Bactrim double strength tablets by mouth twice a day for 7 days and recheck next week if no better or sooner if worse  11/23/16 Patient has been nauseated all weekend.  She has vomited x1.  She denies any diarrhea.  She denies any fevers or chills.  The redness on her left labia has completely resolved.  There is no residual fluctuance and no sign of any spreading infection.  Wound culture revealed normal urogenital flora but no pathologic species.  Clinically the boil has resolved and is healing well with no evidence of spreading infection.  The nausea seems to be exacerbated by the antibiotic Past Medical History:  Diagnosis Date  . Anemia   . Anxiety   . Arthritis   . Full dentures   . HLD (hyperlipidemia)   . Hypertension   . Insomnia   . Smoker    Past Surgical History:  Procedure Laterality Date  . APPENDECTOMY    . BREAST LUMPECTOMY WITH RADIOACTIVE SEED LOCALIZATION Left 01/05/2014   Procedure: LEFT BREAST LUMPECTOMY WITH RADIOACTIVE SEED LOCALIZATION;  Surgeon: Fanny Skates, MD;  Location: [facility];  Service: General;  Laterality: Left;  . TUBAL LIGATION     Current Outpatient Prescriptions on File Prior to Visit  Medication Sig Dispense Refill  . acetaminophen (TYLENOL) 500 MG tablet Take 500 mg by mouth every 6 (six) hours as needed for mild pain.     Marland Kitchen ALPRAZolam (XANAX) 0.5 MG tablet TAKE 1 TABLET BY MOUTH EVERY NIGHT AT BEDTIME AS NEEDED FOR INSOMNIA 30 tablet 1  . atorvastatin (LIPITOR) 20 MG tablet Take 1 tablet (20 mg total) by mouth daily. 90 tablet 3  . losartan-hydrochlorothiazide (HYZAAR) 100-25 MG tablet Take 1 tablet by mouth daily. This replaces losartan 90 tablet 3  . sulfamethoxazole-trimethoprim (BACTRIM DS,SEPTRA DS) 800-160 MG tablet Take 1 tablet by mouth 2 (two) times daily. (Patient not taking: Reported on 11/23/2016) 14 tablet 0   No current facility-administered medications on file prior to visit.    No Known Allergies Social History   Social History  . Marital status: Married    Spouse name: N/A  . Number of children: N/A  . Years of education: N/A   Occupational History  . Not on file.   Social History Main Topics  . Smoking status: Current Every Day Smoker    Packs/day: 1.00    Types: Cigarettes  . Smokeless tobacco: Never Used  . Alcohol use Yes     Comment: Occasional  . Drug use: No  . Sexual activity: Yes    Birth control/ protection: None   Other Topics Concern  . Not on file   Social History Narrative  . No narrative on file      Review of Systems  All other systems reviewed and are negative.  Objective:   Physical Exam  Cardiovascular: Normal rate, regular rhythm and normal heart sounds.   Pulmonary/Chest: Effort normal and breath sounds normal. No respiratory distress. She has no wheezes. She has no rales.  Genitourinary:     Vitals reviewed.         Assessment & Plan:  Non-intractable vomiting with nausea, unspecified vomiting type  Discontinue Bactrim as this is exacerbating her nausea.  Recommended starting Zofran 4 mg every 8 hours as needed for nausea and vomiting.  Recommended a brat diet and gradually advance diet as tolerated.  Anticipate symptoms should improve over the next 48 hours.  Recheck immediately should she develop abdominal pain, fever, or  worsening symptoms.  I feel the majority of this is due to the antibiotic at this point

## 2017-02-12 ENCOUNTER — Other Ambulatory Visit: Payer: Self-pay | Admitting: Family Medicine

## 2017-02-12 NOTE — Telephone Encounter (Signed)
Requesting refill    Xanax  LOV: 11/23/16  LRF:  11/19/16

## 2017-03-15 ENCOUNTER — Other Ambulatory Visit: Payer: Self-pay | Admitting: Family Medicine

## 2017-03-18 ENCOUNTER — Other Ambulatory Visit: Payer: Self-pay | Admitting: Family Medicine

## 2017-03-18 MED ORDER — ALPRAZOLAM 0.5 MG PO TABS: ORAL_TABLET | ORAL | 0 refills | Status: DC

## 2017-03-18 NOTE — Telephone Encounter (Signed)
Pt is requesting refill on Xanax   LOV: 11/23/2016 LRF:  02/12/17

## 2017-03-18 NOTE — Telephone Encounter (Signed)
Pt needs refill on xanax to [facility] [address] .

## 2017-03-18 NOTE — Addendum Note (Signed)
Addended by: Shary Decamp B on: 03/18/2017 09:01 AM   Modules accepted: Orders

## 2017-04-23 ENCOUNTER — Encounter: Payer: Self-pay | Admitting: Family Medicine

## 2017-04-23 ENCOUNTER — Ambulatory Visit: Payer: BLUE CROSS/BLUE SHIELD | Admitting: Family Medicine

## 2017-04-23 VITALS — BP 112/74 | HR 78 | Temp 98.1°F | Resp 16 | Ht 64.0 in | Wt 143.0 lb

## 2017-04-23 DIAGNOSIS — R112 Nausea with vomiting, unspecified: Secondary | ICD-10-CM | POA: Diagnosis not present

## 2017-04-23 LAB — CBC WITH DIFFERENTIAL/PLATELET
BASOS PCT: 0.7 %
Basophils Absolute: 47 cells/uL (ref 0–200)
Eosinophils Absolute: 87 cells/uL (ref 15–500)
Eosinophils Relative: 1.3 %
HCT: 35.4 % (ref 35.0–45.0)
Hemoglobin: 12 g/dL (ref 11.7–15.5)
Lymphs Abs: 2124 cells/uL (ref 850–3900)
MCH: 29.6 pg (ref 27.0–33.0)
MCHC: 33.9 g/dL (ref 32.0–36.0)
MCV: 87.2 fL (ref 80.0–100.0)
MPV: 10.1 fL (ref 7.5–12.5)
Monocytes Relative: 7.4 %
NEUTROS PCT: 58.9 %
Neutro Abs: 3946 cells/uL (ref 1500–7800)
PLATELETS: 341 10*3/uL (ref 140–400)
RBC: 4.06 10*6/uL (ref 3.80–5.10)
RDW: 12.6 % (ref 11.0–15.0)
TOTAL LYMPHOCYTE: 31.7 %
WBC: 6.7 10*3/uL (ref 3.8–10.8)
WBCMIX: 496 {cells}/uL (ref 200–950)

## 2017-04-23 LAB — COMPLETE METABOLIC PANEL WITH GFR
AG RATIO: 1.8 (calc) (ref 1.0–2.5)
ALT: 17 U/L (ref 6–29)
AST: 21 U/L (ref 10–35)
Albumin: 4.4 g/dL (ref 3.6–5.1)
Alkaline phosphatase (APISO): 119 U/L (ref 33–130)
BILIRUBIN TOTAL: 0.7 mg/dL (ref 0.2–1.2)
BUN / CREAT RATIO: 14 (calc) (ref 6–22)
BUN: 21 mg/dL (ref 7–25)
CHLORIDE: 101 mmol/L (ref 98–110)
CO2: 28 mmol/L (ref 20–32)
Calcium: 9.6 mg/dL (ref 8.6–10.4)
Creat: 1.53 mg/dL — ABNORMAL HIGH (ref 0.50–0.99)
GFR, EST AFRICAN AMERICAN: 42 mL/min/{1.73_m2} — AB (ref >=60)
GFR, Est Non African American: 36 mL/min/{1.73_m2} — ABNORMAL LOW (ref >=60)
GLOBULIN: 2.4 g/dL (ref 1.9–3.7)
Glucose, Bld: 115 mg/dL — ABNORMAL HIGH (ref 65–99)
POTASSIUM: 4.1 mmol/L (ref 3.5–5.3)
Sodium: 135 mmol/L (ref 135–146)
TOTAL PROTEIN: 6.8 g/dL (ref 6.1–8.1)

## 2017-04-23 NOTE — Progress Notes (Signed)
Subjective:    Patient ID: Lauren Harrison, female    DOB: 01-Apr-1955, 62 y.o.   MRN: [id number]  HPI Started to feel ill 2-3 days ago.  Reports nausea, lack of appetite, one episode of vomiting, and decreased taste.  Denies fever, myalgias, sob, cough, otalgia, sore throat, sinus pain, head congestion, abdominal pain, or dysuria.  Denies diarrhea.   Past Medical History:  Diagnosis Date  . Anemia   . Anxiety   . Arthritis   . Full dentures   . HLD (hyperlipidemia)   . Hypertension   . Insomnia   . Smoker    Past Surgical History:  Procedure Laterality Date  . APPENDECTOMY    . BREAST LUMPECTOMY WITH RADIOACTIVE SEED LOCALIZATION Left 01/05/2014   Procedure: LEFT BREAST LUMPECTOMY WITH RADIOACTIVE SEED LOCALIZATION;  Surgeon: Fanny Skates, MD;  Location: [facility];  Service: General;  Laterality: Left;  . TUBAL LIGATION     Current Outpatient Medications on File Prior to Visit  Medication Sig Dispense Refill  . acetaminophen (TYLENOL) 500 MG tablet Take 500 mg by mouth every 6 (six) hours as needed for mild pain.    Marland Kitchen ALPRAZolam (XANAX) 0.5 MG tablet 1 po qhs prn 30 tablet 0  . atorvastatin (LIPITOR) 20 MG tablet TAKE 1 TABLET BY MOUTH DAILY 90 tablet 0  . losartan-hydrochlorothiazide (HYZAAR) 100-25 MG tablet Take 1 tablet by mouth daily. This replaces losartan 90 tablet 3  . ondansetron (ZOFRAN) 4 MG tablet Take 1 tablet (4 mg total) by mouth every 8 (eight) hours as needed for nausea or vomiting. 20 tablet 0   No current facility-administered medications on file prior to visit.    No Known Allergies Social History   Socioeconomic History  . Marital status: Married    Spouse name: Not on file  . Number of children: Not on file  . Years of education: Not on file  . Highest education level: Not on file  Occupational History  . Not on file  Social Needs  . Financial resource strain: Not on file  . Food insecurity:    Worry: Not on file    Inability:  Not on file  . Transportation needs:    Medical: Not on file    Non-medical: Not on file  Tobacco Use  . Smoking status: Current Every Day Smoker    Packs/day: 1.00    Types: Cigarettes  . Smokeless tobacco: Never Used  Substance and Sexual Activity  . Alcohol use: Yes    Comment: Occasional  . Drug use: No  . Sexual activity: Yes    Birth control/protection: None  Lifestyle  . Physical activity:    Days per week: Not on file    Minutes per session: Not on file  . Stress: Not on file  Relationships  . Social connections:    Talks on phone: Not on file    Gets together: Not on file    Attends religious service: Not on file    Active member of club or organization: Not on file    Attends meetings of clubs or organizations: Not on file    Relationship status: Not on file  . Intimate partner violence:    Fear of current or ex partner: Not on file    Emotionally abused: Not on file    Physically abused: Not on file    Forced sexual activity: Not on file  Other Topics Concern  . Not on file  Social History  Narrative  . Not on file      Review of Systems  All other systems reviewed and are negative.      Objective:   Physical Exam  Constitutional: She appears well-developed and well-nourished. No distress.  HENT:  Right Ear: External ear normal.  Left Ear: External ear normal.  Nose: Nose normal.  Mouth/Throat: Oropharynx is clear and moist. No oropharyngeal exudate.  Eyes: Conjunctivae are normal.  Neck: Neck supple.  Cardiovascular: Normal rate, regular rhythm and normal heart sounds.  No murmur heard. Pulmonary/Chest: Effort normal and breath sounds normal. No respiratory distress. She has no wheezes. She has no rales.  Abdominal: Soft. Bowel sounds are normal. She exhibits no distension and no mass. There is no tenderness. There is no rebound and no guarding.  Lymphadenopathy:    She has no cervical adenopathy.  Skin: She is not diaphoretic.  Vitals  reviewed. 5 mm inflamed SK on left lower back.  Offered biopsy to confirm but patient elects to monitor it for growth or change.          Assessment & Plan:  Nausea and vomiting, intractability of vomiting not specified, unspecified vomiting type - Plan: CBC with Differential/Platelet, COMPLETE METABOLIC PANEL WITH GFR  Suspect viral syndrome.  Doing better after zofran.  Recommended zofran, brat diet, and push fluids over the weekend.  Recheck Monday if no better.  Check basic labs to rule out other causes (cbc, cmp)

## 2017-04-28 ENCOUNTER — Other Ambulatory Visit: Payer: Self-pay | Admitting: Family Medicine

## 2017-04-29 NOTE — Telephone Encounter (Signed)
Pt is requesting refill on Xanax   LOV: 04/23/17  LRF:   03/18/17

## 2017-04-30 ENCOUNTER — Other Ambulatory Visit: Payer: Self-pay | Admitting: Family Medicine

## 2017-04-30 DIAGNOSIS — R944 Abnormal results of kidney function studies: Secondary | ICD-10-CM

## 2017-05-03 ENCOUNTER — Other Ambulatory Visit: Payer: BLUE CROSS/BLUE SHIELD

## 2017-05-03 DIAGNOSIS — R944 Abnormal results of kidney function studies: Secondary | ICD-10-CM

## 2017-05-03 LAB — BASIC METABOLIC PANEL
BUN: 15 mg/dL (ref 7–25)
CHLORIDE: 106 mmol/L (ref 98–110)
CO2: 30 mmol/L (ref 20–32)
Calcium: 9.2 mg/dL (ref 8.6–10.4)
Creat: 0.97 mg/dL (ref 0.50–0.99)
Glucose, Bld: 111 mg/dL — ABNORMAL HIGH (ref 65–99)
POTASSIUM: 4.1 mmol/L (ref 3.5–5.3)
SODIUM: 139 mmol/L (ref 135–146)

## 2017-05-03 LAB — EXTRA LAV TOP TUBE

## 2017-05-18 ENCOUNTER — Ambulatory Visit: Payer: BLUE CROSS/BLUE SHIELD | Admitting: Family Medicine

## 2017-05-18 ENCOUNTER — Encounter: Payer: Self-pay | Admitting: Family Medicine

## 2017-05-18 VITALS — BP 140/74 | HR 78 | Temp 97.8°F | Resp 18 | Ht 64.0 in | Wt 146.0 lb

## 2017-05-18 DIAGNOSIS — R300 Dysuria: Secondary | ICD-10-CM

## 2017-05-18 LAB — URINALYSIS, ROUTINE W REFLEX MICROSCOPIC
Bilirubin Urine: NEGATIVE
Glucose, UA: NEGATIVE
KETONES UR: NEGATIVE
Nitrite: NEGATIVE
PROTEIN: NEGATIVE
SPECIFIC GRAVITY, URINE: 1.007 (ref 1.001–1.03)
pH: 5.5 (ref 5.0–8.0)

## 2017-05-18 LAB — MICROSCOPIC MESSAGE

## 2017-05-18 MED ORDER — CIPROFLOXACIN HCL 500 MG PO TABS: 500.0000 mg | ORAL_TABLET | Freq: Two times a day (BID) | ORAL | 0 refills | Status: DC

## 2017-05-18 NOTE — Progress Notes (Signed)
Subjective:    Patient ID: Lauren Harrison, female    DOB: September 22, 1955, 62 y.o.   MRN: [id number]  HPI Patient presents with 2 days of dysuria and pelvic pressure.  She reports increased frequency.  She denies any fever or abdominal pain.  She denies any nausea or vomiting.  Urinalysis was significant for leukocyte esterase as well as blood. Past Medical History:  Diagnosis Date  . Anemia   . Anxiety   . Arthritis   . Full dentures   . HLD (hyperlipidemia)   . Hypertension   . Insomnia   . Smoker    Past Surgical History:  Procedure Laterality Date  . APPENDECTOMY    . BREAST LUMPECTOMY WITH RADIOACTIVE SEED LOCALIZATION Left 01/05/2014   Procedure: LEFT BREAST LUMPECTOMY WITH RADIOACTIVE SEED LOCALIZATION;  Surgeon: Fanny Skates, MD;  Location: [facility];  Service: General;  Laterality: Left;  . TUBAL LIGATION     Current Outpatient Medications on File Prior to Visit  Medication Sig Dispense Refill  . acetaminophen (TYLENOL) 500 MG tablet Take 500 mg by mouth every 6 (six) hours as needed for mild pain.    Marland Kitchen ALPRAZolam (XANAX) 0.5 MG tablet TAKE 1 TABLET BY MOUTH EVERY NIGHT AT BEDTIME AS NEEDED 30 tablet 0  . atorvastatin (LIPITOR) 20 MG tablet TAKE 1 TABLET BY MOUTH DAILY 90 tablet 0  . losartan-hydrochlorothiazide (HYZAAR) 100-25 MG tablet Take 1 tablet by mouth daily. This replaces losartan 90 tablet 3  . ondansetron (ZOFRAN) 4 MG tablet Take 1 tablet (4 mg total) by mouth every 8 (eight) hours as needed for nausea or vomiting. 20 tablet 0   No current facility-administered medications on file prior to visit.    No Known Allergies Social History   Socioeconomic History  . Marital status: Married    Spouse name: Not on file  . Number of children: Not on file  . Years of education: Not on file  . Highest education level: Not on file  Occupational History  . Not on file  Social Needs  . Financial resource strain: Not on file  . Food insecurity:   Worry: Not on file    Inability: Not on file  . Transportation needs:    Medical: Not on file    Non-medical: Not on file  Tobacco Use  . Smoking status: Current Every Day Smoker    Packs/day: 1.00    Types: Cigarettes  . Smokeless tobacco: Never Used  Substance and Sexual Activity  . Alcohol use: Yes    Comment: Occasional  . Drug use: No  . Sexual activity: Yes    Birth control/protection: None  Lifestyle  . Physical activity:    Days per week: Not on file    Minutes per session: Not on file  . Stress: Not on file  Relationships  . Social connections:    Talks on phone: Not on file    Gets together: Not on file    Attends religious service: Not on file    Active member of club or organization: Not on file    Attends meetings of clubs or organizations: Not on file    Relationship status: Not on file  . Intimate partner violence:    Fear of current or ex partner: Not on file    Emotionally abused: Not on file    Physically abused: Not on file    Forced sexual activity: Not on file  Other Topics Concern  . Not on  file  Social History Narrative  . Not on file      Review of Systems  All other systems reviewed and are negative.      Objective:   Physical Exam  Cardiovascular: Normal rate and normal heart sounds.  Pulmonary/Chest: Effort normal and breath sounds normal. No respiratory distress. She has no wheezes. She has no rales.  Abdominal: Soft. Bowel sounds are normal. She exhibits no distension. There is no tenderness. There is no rebound.  Vitals reviewed.         Assessment & Plan:  Dysuria - Plan: Urinalysis, Routine w reflex microscopic  I suspect urinary tract infection.  Begin Cipro 500 mg p.o. twice daily for 3 days

## 2017-06-07 ENCOUNTER — Other Ambulatory Visit: Payer: Self-pay | Admitting: Family Medicine

## 2017-06-07 NOTE — Telephone Encounter (Signed)
Pt is requesting refill on Xanax   LOV: 05/18/17  LRF:   04/29/17

## 2017-06-14 ENCOUNTER — Other Ambulatory Visit: Payer: Self-pay | Admitting: Family Medicine

## 2017-07-08 ENCOUNTER — Other Ambulatory Visit: Payer: Self-pay | Admitting: Family Medicine

## 2017-07-08 NOTE — Telephone Encounter (Signed)
Pt is requesting refill on Xanax   LOV: 05/18/17  LRF:   06/07/17

## 2017-08-13 ENCOUNTER — Ambulatory Visit: Payer: BLUE CROSS/BLUE SHIELD | Admitting: Family Medicine

## 2017-08-13 ENCOUNTER — Encounter: Payer: Self-pay | Admitting: Family Medicine

## 2017-08-13 VITALS — BP 110/80 | HR 84 | Temp 97.7°F | Resp 16 | Ht 64.0 in | Wt 140.0 lb

## 2017-08-13 DIAGNOSIS — Z09 Encounter for follow-up examination after completed treatment for conditions other than malignant neoplasm: Secondary | ICD-10-CM | POA: Diagnosis not present

## 2017-08-13 DIAGNOSIS — R55 Syncope and collapse: Secondary | ICD-10-CM

## 2017-08-13 LAB — CBC WITH DIFFERENTIAL/PLATELET
BASOS PCT: 0.7 %
Basophils Absolute: 57 cells/uL (ref 0–200)
EOS PCT: 1.7 %
Eosinophils Absolute: 139 cells/uL (ref 15–500)
HEMATOCRIT: 32.6 % — AB (ref 35.0–45.0)
HEMOGLOBIN: 11.1 g/dL — AB (ref 11.7–15.5)
LYMPHS ABS: 2837 {cells}/uL (ref 850–3900)
MCH: 30 pg (ref 27.0–33.0)
MCHC: 34 g/dL (ref 32.0–36.0)
MCV: 88.1 fL (ref 80.0–100.0)
MPV: 10.6 fL (ref 7.5–12.5)
Monocytes Relative: 5.6 %
NEUTROS ABS: 4707 {cells}/uL (ref 1500–7800)
Neutrophils Relative %: 57.4 %
Platelets: 334 10*3/uL (ref 140–400)
RBC: 3.7 10*6/uL — ABNORMAL LOW (ref 3.80–5.10)
RDW: 13 % (ref 11.0–15.0)
Total Lymphocyte: 34.6 %
WBC mixed population: 459 cells/uL (ref 200–950)
WBC: 8.2 10*3/uL (ref 3.8–10.8)

## 2017-08-13 LAB — COMPLETE METABOLIC PANEL WITH GFR
AG Ratio: 1.8 (calc) (ref 1.0–2.5)
ALBUMIN MSPROF: 4.5 g/dL (ref 3.6–5.1)
ALT: 14 U/L (ref 6–29)
AST: 16 U/L (ref 10–35)
Alkaline phosphatase (APISO): 112 U/L (ref 33–130)
BUN/Creatinine Ratio: 15 (calc) (ref 6–22)
BUN: 16 mg/dL (ref 7–25)
CALCIUM: 10.1 mg/dL (ref 8.6–10.4)
CO2: 27 mmol/L (ref 20–32)
CREATININE: 1.07 mg/dL — AB (ref 0.50–0.99)
Chloride: 101 mmol/L (ref 98–110)
GFR, EST NON AFRICAN AMERICAN: 56 mL/min/{1.73_m2} — AB (ref >=60)
GFR, Est African American: 65 mL/min/{1.73_m2} (ref >=60)
GLOBULIN: 2.5 g/dL (ref 1.9–3.7)
Glucose, Bld: 104 mg/dL — ABNORMAL HIGH (ref 65–99)
Potassium: 4.1 mmol/L (ref 3.5–5.3)
SODIUM: 137 mmol/L (ref 135–146)
TOTAL PROTEIN: 7 g/dL (ref 6.1–8.1)
Total Bilirubin: 0.5 mg/dL (ref 0.2–1.2)

## 2017-08-13 LAB — IRON: Iron: 35 ug/dL — ABNORMAL LOW (ref 45–160)

## 2017-08-13 MED ORDER — LOSARTAN POTASSIUM 100 MG PO TABS: 100.0000 mg | ORAL_TABLET | Freq: Every day | ORAL | 3 refills | Status: DC

## 2017-08-13 NOTE — Progress Notes (Signed)
Subjective:    Patient ID: Lauren Harrison, female    DOB: [birth year], 62 y.o.   MRN: [id number]  HPI  Patient was recently admitted to the hospital with a syncopal episode.  She states that she was outside in the heat and that she had smoked a joint.  Suddenly she felt extremely lightheaded and weak.  She felt like she was going to pass out.  Her husband ran inside to get a cool rag and when he came back, the patient was sitting upright in a chair unconscious having vomited on her shirt.  He lowered her to the ground and as soon as she laid supine, she regained consciousness.  She was taken to an outside hospital where she was monitored on telemetry for 24 hours and based on the patient's description I assume that she had a 24-hour rule out for myocardial infarction as she stated that they were drawing blood work every 6 hours.  She states that she has been feeling tired and sluggish recently but she denies any chest pain shortness of breath or dyspnea on exertion.  Her blood pressure at home has been between 90 and 110/50-60 which is extremely low and sounds like she may be overmedicated.  She denies any tachycardia.  She denies any arrhythmias.  She denies any palpitations.  She denies any pleurisy or symptoms of a pulmonary embolism. Past Medical History:  Diagnosis Date  . Anemia   . Anxiety   . Arthritis   . Full dentures   . HLD (hyperlipidemia)   . Hypertension   . Insomnia   . Smoker    Past Surgical History:  Procedure Laterality Date  . APPENDECTOMY    . BREAST LUMPECTOMY WITH RADIOACTIVE SEED LOCALIZATION Left 01/05/2014   Procedure: LEFT BREAST LUMPECTOMY WITH RADIOACTIVE SEED LOCALIZATION;  Surgeon: Fanny Skates, MD;  Location: [facility];  Service: General;  Laterality: Left;  . TUBAL LIGATION     Current Outpatient Medications on File Prior to Visit  Medication Sig Dispense Refill  . ALPRAZolam (XANAX) 0.5 MG tablet TAKE 1 TABLET BY MOUTH EVERY NIGHT AT  BEDTIME AS NEEDED 30 tablet 2  . atorvastatin (LIPITOR) 20 MG tablet TAKE 1 TABLET BY MOUTH DAILY 90 tablet 1  . losartan-hydrochlorothiazide (HYZAAR) 100-25 MG tablet Take 1 tablet by mouth daily. This replaces losartan 90 tablet 3   No current facility-administered medications on file prior to visit.    No Known Allergies Social History   Socioeconomic History  . Marital status: Married    Spouse name: Not on file  . Number of children: Not on file  . Years of education: Not on file  . Highest education level: Not on file  Occupational History  . Not on file  Social Needs  . Financial resource strain: Not on file  . Food insecurity:    Worry: Not on file    Inability: Not on file  . Transportation needs:    Medical: Not on file    Non-medical: Not on file  Tobacco Use  . Smoking status: Current Every Day Smoker    Packs/day: 1.00    Types: Cigarettes  . Smokeless tobacco: Never Used  Substance and Sexual Activity  . Alcohol use: Yes    Comment: Occasional  . Drug use: Yes    Types: Marijuana  . Sexual activity: Yes    Birth control/protection: None  Lifestyle  . Physical activity:    Days per week: Not on file  Minutes per session: Not on file  . Stress: Not on file  Relationships  . Social connections:    Talks on phone: Not on file    Gets together: Not on file    Attends religious service: Not on file    Active member of club or organization: Not on file    Attends meetings of clubs or organizations: Not on file    Relationship status: Not on file  . Intimate partner violence:    Fear of current or ex partner: Not on file    Emotionally abused: Not on file    Physically abused: Not on file    Forced sexual activity: Not on file  Other Topics Concern  . Not on file  Social History Narrative  . Not on file     Review of Systems  All other systems reviewed and are negative.      Objective:   Physical Exam  Constitutional: She is oriented to  person, place, and time. She appears well-developed and well-nourished. No distress.  Cardiovascular: Normal rate, regular rhythm, normal heart sounds and intact distal pulses. Exam reveals no gallop and no friction rub.  No murmur heard. Pulmonary/Chest: Effort normal and breath sounds normal. No stridor. No respiratory distress. She has no wheezes. She has no rales. She exhibits no tenderness.  Abdominal: Soft. Bowel sounds are normal. She exhibits no distension and no mass. There is no tenderness. There is no rebound and no guarding.  Neurological: She is alert and oriented to person, place, and time. She displays normal reflexes. No cranial nerve deficit or sensory deficit. She exhibits normal muscle tone. Coordination normal.  Skin: She is not diaphoretic.  Vitals reviewed.         Assessment & Plan:  Vasovagal syncope - Plan: CBC with Differential/Platelet, COMPLETE METABOLIC PANEL WITH GFR, Iron  Hospital discharge follow-up  Patient sounds like she had a vasovagal syncopal episode secondary to hypotension possibly due to a combination of dehydration, illicit substance abuse, and overmedication.  I recommended discontinuation of Hyzaar and replacing it with losartan 100 mg a day.  I recommended that she drink more fluids to avoid dehydration and that she avoid cannabis.  Check a CBC and a CMP.  If patient has any further syncopal episodes or near syncope, I would recommend an echocardiogram and an event monitor.

## 2017-08-17 ENCOUNTER — Encounter: Payer: Self-pay | Admitting: Family Medicine

## 2017-09-03 ENCOUNTER — Encounter: Payer: Self-pay | Admitting: Family Medicine

## 2017-09-03 ENCOUNTER — Ambulatory Visit: Payer: BLUE CROSS/BLUE SHIELD | Admitting: Family Medicine

## 2017-09-03 VITALS — BP 150/86 | HR 74 | Temp 97.8°F | Resp 16 | Ht 64.0 in | Wt 144.0 lb

## 2017-09-03 DIAGNOSIS — I10 Essential (primary) hypertension: Secondary | ICD-10-CM

## 2017-09-03 MED ORDER — AMLODIPINE BESYLATE 5 MG PO TABS: 5.0000 mg | ORAL_TABLET | Freq: Every day | ORAL | 3 refills | Status: DC

## 2017-09-03 NOTE — Progress Notes (Signed)
Subjective:    Patient ID: Lauren Harrison, female    DOB: [birth year], 62 y.o.   MRN: [id number]  HPI 08/13/17 Patient was recently admitted to the hospital with a syncopal episode.  She states that she was outside in the heat and that she had smoked a joint.  Suddenly she felt extremely lightheaded and weak.  She felt like she was going to pass out.  Her husband ran inside to get a cool rag and when he came back, the patient was sitting upright in a chair unconscious having vomited on her shirt.  He lowered her to the ground and as soon as she laid supine, she regained consciousness.  She was taken to an outside hospital where she was monitored on telemetry for 24 hours and based on the patient's description I assume that she had a 24-hour rule out for myocardial infarction as she stated that they were drawing blood work every 6 hours.  She states that she has been feeling tired and sluggish recently but she denies any chest pain shortness of breath or dyspnea on exertion.  Her blood pressure at home has been between 90 and 110/50-60 which is extremely low and sounds like she may be overmedicated.  She denies any tachycardia.  She denies any arrhythmias.  She denies any palpitations.  She denies any pleurisy or symptoms of a pulmonary embolism.  At that time, my plan was: Patient sounds like she had a vasovagal syncopal episode secondary to hypotension possibly due to a combination of dehydration, illicit substance abuse, and overmedication.  I recommended discontinuation of Hyzaar and replacing it with losartan 100 mg a day.  I recommended that she drink more fluids to avoid dehydration and that she avoid cannabis.  Check a CBC and a CMP.  If patient has any further syncopal episodes or near syncope, I would recommend an echocardiogram and an event monitor.  09/03/17 Since discontinuing hydrochlorothiazide, the patient's blood pressure has been averaging 146-169/79-93.  She is checking it in the morning  and in the evenings at home and they are all consistently elevated most are greater than 150/90.  She feels much better.  She feels less fatigue however she is concerned by the elevation in her blood pressure. Past Medical History:  Diagnosis Date  . Anemia   . Anxiety   . Arthritis   . Full dentures   . HLD (hyperlipidemia)   . Hypertension   . Insomnia   . Smoker    Past Surgical History:  Procedure Laterality Date  . APPENDECTOMY    . BREAST LUMPECTOMY WITH RADIOACTIVE SEED LOCALIZATION Left 01/05/2014   Procedure: LEFT BREAST LUMPECTOMY WITH RADIOACTIVE SEED LOCALIZATION;  Surgeon: Fanny Skates, MD;  Location: [facility];  Service: General;  Laterality: Left;  . TUBAL LIGATION     Current Outpatient Medications on File Prior to Visit  Medication Sig Dispense Refill  . ALPRAZolam (XANAX) 0.5 MG tablet TAKE 1 TABLET BY MOUTH EVERY NIGHT AT BEDTIME AS NEEDED 30 tablet 2  . atorvastatin (LIPITOR) 20 MG tablet TAKE 1 TABLET BY MOUTH DAILY 90 tablet 1  . losartan (COZAAR) 100 MG tablet Take 1 tablet (100 mg total) by mouth daily. 90 tablet 3   No current facility-administered medications on file prior to visit.    No Known Allergies Social History   Socioeconomic History  . Marital status: Married    Spouse name: Not on file  . Number of children: Not on file  .  Years of education: Not on file  . Highest education level: Not on file  Occupational History  . Not on file  Social Needs  . Financial resource strain: Not on file  . Food insecurity:    Worry: Not on file    Inability: Not on file  . Transportation needs:    Medical: Not on file    Non-medical: Not on file  Tobacco Use  . Smoking status: Current Every Day Smoker    Packs/day: 1.00    Types: Cigarettes  . Smokeless tobacco: Never Used  Substance and Sexual Activity  . Alcohol use: Yes    Comment: Occasional  . Drug use: Yes    Types: Marijuana  . Sexual activity: Yes    Birth  control/protection: None  Lifestyle  . Physical activity:    Days per week: Not on file    Minutes per session: Not on file  . Stress: Not on file  Relationships  . Social connections:    Talks on phone: Not on file    Gets together: Not on file    Attends religious service: Not on file    Active member of club or organization: Not on file    Attends meetings of clubs or organizations: Not on file    Relationship status: Not on file  . Intimate partner violence:    Fear of current or ex partner: Not on file    Emotionally abused: Not on file    Physically abused: Not on file    Forced sexual activity: Not on file  Other Topics Concern  . Not on file  Social History Narrative  . Not on file     Review of Systems  All other systems reviewed and are negative.      Objective:   Physical Exam  Constitutional: She is oriented to person, place, and time. She appears well-developed and well-nourished. No distress.  Cardiovascular: Normal rate, regular rhythm, normal heart sounds and intact distal pulses. Exam reveals no gallop and no friction rub.  No murmur heard. Pulmonary/Chest: Effort normal and breath sounds normal. No stridor. No respiratory distress. She has no wheezes. She has no rales. She exhibits no tenderness.  Abdominal: Soft. Bowel sounds are normal. She exhibits no distension and no mass. There is no tenderness. There is no rebound and no guarding.  Neurological: She is alert and oriented to person, place, and time. She displays normal reflexes. No cranial nerve deficit or sensory deficit. She exhibits normal muscle tone. Coordination normal.  Skin: She is not diaphoretic.  Vitals reviewed.         Assessment & Plan:  Benign essential HTN  Continue losartan 100 mg a day.  Patient does not want to resume hydrochlorothiazide given the way she felt previously.  Therefore we will try amlodipine 5 mg a day and reassess blood pressure in 2 to 4 weeks.

## 2017-09-17 ENCOUNTER — Ambulatory Visit: Payer: BLUE CROSS/BLUE SHIELD | Admitting: Family Medicine

## 2017-10-11 ENCOUNTER — Other Ambulatory Visit: Payer: Self-pay | Admitting: Family Medicine

## 2017-10-11 NOTE — Telephone Encounter (Signed)
Ok to refill??  Last office visit 09/03/2017.  Last refill 07/08/2017, #2 refills.

## 2017-10-15 ENCOUNTER — Encounter: Payer: Self-pay | Admitting: Family Medicine

## 2017-10-15 ENCOUNTER — Ambulatory Visit: Payer: BLUE CROSS/BLUE SHIELD | Admitting: Family Medicine

## 2017-10-15 ENCOUNTER — Other Ambulatory Visit: Payer: Self-pay

## 2017-10-15 VITALS — BP 140/82 | HR 66 | Temp 98.2°F | Resp 16 | Ht 64.0 in | Wt 142.0 lb

## 2017-10-15 DIAGNOSIS — Z23 Encounter for immunization: Secondary | ICD-10-CM | POA: Diagnosis not present

## 2017-10-15 DIAGNOSIS — L240 Irritant contact dermatitis due to detergents: Secondary | ICD-10-CM

## 2017-10-15 DIAGNOSIS — R21 Rash and other nonspecific skin eruption: Secondary | ICD-10-CM | POA: Diagnosis not present

## 2017-10-15 MED ORDER — TERBINAFINE HCL 1 % EX CREA: 1.0000 "application " | TOPICAL_CREAM | Freq: Two times a day (BID) | CUTANEOUS | 0 refills | Status: DC

## 2017-10-15 MED ORDER — TRIAMCINOLONE ACETONIDE 0.1 % EX OINT: 1.0000 "application " | TOPICAL_OINTMENT | Freq: Two times a day (BID) | CUTANEOUS | 1 refills | Status: DC

## 2017-10-15 MED ORDER — ZOSTER VAC RECOMB ADJUVANTED 50 MCG/0.5ML IM SUSR: 0.5000 mL | Freq: Once | INTRAMUSCULAR | 1 refills | Status: AC

## 2017-10-15 NOTE — Progress Notes (Signed)
Patient ID: Lauren Harrison, female    DOB: January 11, 1956, 62 y.o.   MRN: [id number]  PCP: Susy Frizzle, MD  Chief Complaint  Patient presents with  . Rash    irritation to BLE- started on feet and in making it's way up to legs- small red areas of irrtiation that itch    Subjective:   Lauren Harrison is a 62 y.o. female, presents to clinic with CC of multiple areas of rash BL hand rash, chronic x 4 years, reports that it is chronically dry, cracked with itching.  She applies ointment infrequently and when she does it appears red.  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 Went to rheumatology wanted to start meds for possible psoriasis?  She currently denies any oral treatment or topical steroids.  She is just using over-the-counter creams He also complains of multiple areas to her bilateral lower extremities with red itchy patches and thickened patches scales to left ankle.  She has not been trying any over-the-counter prescription treatment for these.  She has difficulty stating how long they have been there.   Other than her hands at work she denies any other exposure to irritants, plants, new soaps lotions or detergents.  She denies any other rash to her trunk.  She reports associated arthritis for the past 4 years.  She denies any swollen, painful or draining areas.  Denies any fever, chills, sweats, shortness of breath, wheeze, swelling of mouth or airway.  Patient Active Problem List   Diagnosis Date Noted  . Abnormal mammogram of left breast 01/05/2014  . Smoker   . Insomnia      Prior to Admission medications   Medication Sig Start Date End Date Taking? Authorizing Provider  ALPRAZolam Duanne Moron) 0.5 MG tablet TAKE 1 TABLET BY MOUTH EVERY NIGHT AT BEDTIME AS NEEDED 10/11/17  Yes Susy Frizzle, MD  amLODipine (NORVASC) 5 MG tablet Take 1 tablet (5 mg total) by mouth daily. 09/03/17  Yes  Susy Frizzle, MD  atorvastatin (LIPITOR) 20 MG tablet TAKE 1 TABLET BY MOUTH DAILY 06/14/17  Yes Susy Frizzle, MD  losartan (COZAAR) 100 MG tablet Take 1 tablet (100 mg total) by mouth daily. 08/13/17  Yes Susy Frizzle, MD   Social history -current cigarette smoker, marijuana user  No Known Allergies   Family History  Problem Relation Age of Onset  . Cancer Father   . Heart disease Sister   . Stroke Mother   . Heart disease Mother      Review of Systems  Constitutional: Negative.   HENT: Negative.   Eyes: Negative.   Respiratory: Negative.   Cardiovascular: Negative.   Gastrointestinal: Negative.   Endocrine: Negative.   Genitourinary: Negative.   Musculoskeletal: Negative.   Skin: Negative.   Allergic/Immunologic: Negative.   Neurological: Negative.   Hematological: Negative.   Psychiatric/Behavioral: Negative.   All other systems reviewed and are negative.      Objective:    Vitals:   10/15/17 0851  BP: 140/82  Pulse: 66  Resp: 16  Temp: 98.2 F (36.8 C)  TempSrc: Oral  SpO2: 100%  Weight: 142 lb (64.4 kg)  Height: 5\' 4"  (1.626 m)      Physical Exam  Constitutional: She appears well-developed and well-nourished. No distress.  HENT:  Head: Normocephalic and atraumatic.  Nose: Nose normal.  Mouth/Throat: Oropharynx is clear and moist.  Eyes: Pupils are equal, round, and reactive to light. Conjunctivae and EOM are normal. Right eye exhibits no discharge. Left eye exhibits no discharge.  Neck: Normal range of motion. No tracheal deviation present.  Cardiovascular: Normal rate, regular rhythm, normal heart sounds and intact distal pulses.  Pulmonary/Chest: Effort normal and breath sounds normal. No stridor. No respiratory distress. She has no wheezes.  Abdominal: Soft. Bowel sounds are normal.  Musculoskeletal: Normal range of motion.  Neurological: She is alert. She exhibits normal muscle tone. Coordination normal.  Skin: Skin is warm and  dry. Rash noted. She is not diaphoretic.  Bilateral dorsum of hands with diffuse dried, flaking and plaque-like rash to the entire surface with erythematous/pink base, no fissures no drainage  Right lower extremity with 1.5 cm diameter erythematous annular patch   Left anterior ankle with multiple areas of well circumcised rash, largest in roughly 4 x 6 cm silvery plaque without cracking or erythema, several other smaller 1 to 2 cm erythematous raised patches without central clearing  Psychiatric: She has a normal mood and affect. Her behavior is normal.  Nursing note and vitals reviewed.     Right lower leg  Left anterior ankle          Assessment & Plan:      ICD-10-CM   1. Contact dermatitis and eczema due to detergents L24.0 Ambulatory referral to Dermatology  2. Rash and nonspecific skin eruption R21 Ambulatory referral to Dermatology    triamcinolone ointment (KENALOG) 0.1 %    terbinafine (LAMISIL) 1 % cream  3. Need for tetanus, diphtheria, and acellular pertussis (Tdap) vaccine in patient of adolescent age or older Z23 Tdap vaccine greater than or equal to 7yo IM  4. Need for immunization against influenza Z23 Flu Vaccine QUAD 36+ mos IM  5. Need for shingles vaccine Z23 Zoster Vaccine Adjuvanted [facility]) injection    Unclear patient has history of eczema, psoriasis or if her hands have a chronic contact dermatitis.  We will start treating by avoiding all detergents and irritants, work note given for her to either stop exposing hands to this or to allow her to wear gloves as a barrier.  She was advised to decrease amount of washing and bathing and to use mild soaps.  Her hand she is to pat dry and seal her skin with Vaseline immediately after have them be wet or washed.  Apply topical Kenalog cream to thickened and patchy areas.   For more erythematous areas will try Lamisil and Kenalog.  Does appear somewhat similar to nummular/annular eczema, could also be psoriasis  plaques in various stages or some tinea although there is the lack of central clearing.  Do not think a topical antifungal could worsen her symptoms so we will have her try it on the more erythematous areas.  Will refer to dermatology because of chronic and severe nature of rash.  Patient agreed to update Tdap and to receive flu vaccine today.  Afebrile.  Feel rash is more chronic than acute and vaccines will be appropriate today  Delsa Grana, PA-C 10/15/17 9:14 AM

## 2017-10-15 NOTE — Patient Instructions (Addendum)
Terbinafine to red rash areas 2 - 3 weeks  Triamcinolone - steroid 2x a day to all rash, patches, itchy areas     Contact Dermatitis Dermatitis is redness, soreness, and swelling (inflammation) of the skin. Contact dermatitis is a reaction to certain substances that touch the skin. You either touched something that irritated your skin, or you have allergies to something you touched. Follow these instructions at home: [place] your skin as needed.  Apply cool compresses to the affected areas.  Try taking a bath with: ? Epsom salts. Follow the instructions on the package. You can get these at a pharmacy or grocery store. ? Baking soda. Pour a small amount into the bath as told by your doctor. ? Colloidal oatmeal. Follow the instructions on the package. You can get this at a pharmacy or grocery store.  Try applying baking soda paste to your skin. Stir water into baking soda until it looks like paste.  Do not scratch your skin.  Bathe less often.  Bathe in lukewarm water. Avoid using hot water. Medicines  Take or apply over-the-counter and prescription medicines only as told by your doctor.  If you were prescribed an antibiotic medicine, take or apply your antibiotic as told by your doctor. Do not stop taking the antibiotic even if your condition starts to get better. General instructions  Keep all follow-up visits as told by your doctor. This is important.  Avoid the substance that caused your reaction. If you do not know what caused it, keep a journal to try to track what caused it. Write down: ? What you eat. ? What cosmetic products you use. ? What you drink. ? What you wear in the affected area. This includes jewelry.  If you were given a bandage (dressing), take care of it as told by your doctor. This includes when to change and remove it. Contact a doctor if:  You do not get better with treatment.  Your condition gets worse.  You have signs of  infection such as: ? Swelling. ? Tenderness. ? Redness. ? Soreness. ? Warmth.  You have a fever.  You have new symptoms. Get help right away if:  You have a very bad headache.  You have neck pain.  Your neck is stiff.  You throw up (vomit).  You feel very sleepy.  You see red streaks coming from the affected area.  Your bone or joint underneath the affected area becomes painful after the skin has healed.  The affected area turns darker.  You have trouble breathing. This information is not intended to replace advice given to you by your health care provider. Make sure you discuss any questions you have with your health care provider. Document Released: 11/16/2008 Document Revised: 06/27/2015 Document Reviewed: 06/06/2014 Elsevier Interactive Patient Education  2018 [employer].

## 2017-11-02 ENCOUNTER — Ambulatory Visit: Payer: BLUE CROSS/BLUE SHIELD | Admitting: Family Medicine

## 2017-11-12 ENCOUNTER — Other Ambulatory Visit: Payer: Self-pay | Admitting: Family Medicine

## 2017-11-15 ENCOUNTER — Other Ambulatory Visit: Payer: Self-pay | Admitting: Family Medicine

## 2017-12-06 ENCOUNTER — Other Ambulatory Visit: Payer: Self-pay | Admitting: Family Medicine

## 2018-01-12 ENCOUNTER — Other Ambulatory Visit: Payer: Self-pay | Admitting: Family Medicine

## 2018-01-12 NOTE — Telephone Encounter (Signed)
Ok to refill??  Last office visit 10/15/2017.  Last refill 10/11/2017, #2 refills.

## 2018-02-11 ENCOUNTER — Other Ambulatory Visit: Payer: Self-pay | Admitting: Family Medicine

## 2018-02-11 NOTE — Telephone Encounter (Signed)
Pt is requesting refill on Xanax   LOV: 09/03/17  LRF:   01/13/18

## 2018-02-15 ENCOUNTER — Ambulatory Visit (INDEPENDENT_AMBULATORY_CARE_PROVIDER_SITE_OTHER): Payer: BLUE CROSS/BLUE SHIELD | Admitting: Family Medicine

## 2018-02-15 ENCOUNTER — Ambulatory Visit: Payer: BLUE CROSS/BLUE SHIELD | Admitting: Family Medicine

## 2018-02-15 VITALS — BP 140/72 | HR 84 | Temp 97.8°F | Resp 18 | Ht 64.0 in | Wt 143.0 lb

## 2018-02-15 DIAGNOSIS — K921 Melena: Secondary | ICD-10-CM | POA: Diagnosis not present

## 2018-02-15 DIAGNOSIS — K5909 Other constipation: Secondary | ICD-10-CM

## 2018-02-15 DIAGNOSIS — L304 Erythema intertrigo: Secondary | ICD-10-CM

## 2018-02-15 MED ORDER — CLOTRIMAZOLE-BETAMETHASONE 1-0.05 % EX CREA: 1.0000 "application " | TOPICAL_CREAM | Freq: Two times a day (BID) | CUTANEOUS | 0 refills | Status: DC

## 2018-02-15 NOTE — Progress Notes (Signed)
Subjective:    Patient ID: Lauren Harrison, female    DOB: 1955-08-31, 63 y.o.   MRN: [id number]  HPI Patient presents with several issues.  Over the last 4 to 6 weeks, she has been battling constipation.  She will go less than 1 time a day.  When she goes is very little that comes out.  Over the last week it is substantially worsened and now she is having cramping abdominal pain all throughout her abdomen.  She denies having a normal bowel movement in quite some time.  When she does have a bowel movement, it is small hard balls.  She also has to strain so hard she is seeing blood when she defecates.  This is been going on now for several weeks.  It is a small amount of blood.  It is bright red in nature.  Rectal exam was performed today.  There are no external hemorrhoids.  There is no rectal mass.  There is no impaction or obstruction that I can palpate with my index finger.  She does have an erythematous rash around her rectum.  It is a patch of red skin with sharp serpiginous borders.  She has been treated for plaque psoriasis with a strong steroid cream both in the crease of her buttocks as well as on her legs.  The psoriasis is improving however I suspect that the steroid cream may have weakened her immune system and allowed her to develop a secondary Candida intertrigo in the perirectal crease Past Medical History:  Diagnosis Date  . Anemia   . Anxiety   . Arthritis   . Full dentures   . HLD (hyperlipidemia)   . Hypertension   . Insomnia   . Smoker    Past Surgical History:  Procedure Laterality Date  . APPENDECTOMY    . BREAST LUMPECTOMY WITH RADIOACTIVE SEED LOCALIZATION Left 01/05/2014   Procedure: LEFT BREAST LUMPECTOMY WITH RADIOACTIVE SEED LOCALIZATION;  Surgeon: Fanny Skates, MD;  Location: [facility];  Service: General;  Laterality: Left;  . TUBAL LIGATION     Current Outpatient Medications on File Prior to Visit  Medication Sig Dispense Refill  . ALPRAZolam  (XANAX) 0.5 MG tablet TAKE 1 TABLET BY MOUTH EVERY NIGHT AT BEDTIME AS NEEDED 30 tablet 1  . amLODipine (NORVASC) 5 MG tablet Take 1 tablet (5 mg total) by mouth daily. 90 tablet 3  . atorvastatin (LIPITOR) 20 MG tablet TAKE 1 TABLET BY MOUTH DAILY 90 tablet 1  . Halobetasol Prop-Tazarotene (DUOBRII) 0.01-0.045 % LOTN Apply topically.    Marland Kitchen losartan (COZAAR) 100 MG tablet TAKE 1 TABLET BY MOUTH DAILY 90 tablet 3  . losartan-hydrochlorothiazide (HYZAAR) 100-25 MG tablet TAKE 1 TABLET BY MOUTH EVERY DAY 90 tablet 3   No current facility-administered medications on file prior to visit.    No Known Allergies Social History   Socioeconomic History  . Marital status: Married    Spouse name: Not on file  . Number of children: Not on file  . Years of education: Not on file  . Highest education level: Not on file  Occupational History  . Not on file  Social Needs  . Financial resource strain: Not on file  . Food insecurity:    Worry: Not on file    Inability: Not on file  . Transportation needs:    Medical: Not on file    Non-medical: Not on file  Tobacco Use  . Smoking status: Current Every Day Smoker  Packs/day: 1.00    Types: Cigarettes  . Smokeless tobacco: Never Used  Substance and Sexual Activity  . Alcohol use: Yes    Comment: Occasional  . Drug use: Yes    Types: Marijuana  . Sexual activity: Yes    Birth control/protection: None  Lifestyle  . Physical activity:    Days per week: Not on file    Minutes per session: Not on file  . Stress: Not on file  Relationships  . Social connections:    Talks on phone: Not on file    Gets together: Not on file    Attends religious service: Not on file    Active member of club or organization: Not on file    Attends meetings of clubs or organizations: Not on file    Relationship status: Not on file  . Intimate partner violence:    Fear of current or ex partner: Not on file    Emotionally abused: Not on file    Physically  abused: Not on file    Forced sexual activity: Not on file  Other Topics Concern  . Not on file  Social History Narrative  . Not on file     Review of Systems  Gastrointestinal: Positive for constipation.  All other systems reviewed and are negative.      Objective:   Physical Exam Vitals signs reviewed.  Constitutional:      General: She is not in acute distress.    Appearance: She is well-developed. She is not diaphoretic.  Cardiovascular:     Rate and Rhythm: Normal rate and regular rhythm.     Heart sounds: Normal heart sounds. No murmur. No friction rub. No gallop.   Pulmonary:     Effort: Pulmonary effort is normal. No respiratory distress.     Breath sounds: Normal breath sounds. No stridor. No wheezing or rales.  Chest:     Chest wall: No tenderness.  Abdominal:     General: Bowel sounds are normal. There is no distension.     Palpations: Abdomen is soft. There is no mass.     Tenderness: There is no abdominal tenderness. There is no guarding or rebound.  Genitourinary:   Neurological:     Mental Status: She is alert.     Motor: No abnormal muscle tone.           Assessment & Plan:  Chronic constipation - Plan: Ambulatory referral to Gastroenterology  Intertrigo  Blood in stool - Plan: Ambulatory referral to Gastroenterology Recommended GI consult for colonoscopy.  She is 63 years old and has never been screened for colon cancer.  Now that she seen blood in her stool I have encouraged her to get the screening done.  However I believe that I would treat her chronic constipation with Linzess 145 mcg p.o. daily and then reassess in 1 week.  I will treat the Candida intertrigo in the perirectal area is drawn with Lotrisone cream twice a day for 10 days

## 2018-02-18 ENCOUNTER — Encounter: Payer: Self-pay | Admitting: Gastroenterology

## 2018-04-15 ENCOUNTER — Other Ambulatory Visit: Payer: Self-pay | Admitting: Family Medicine

## 2018-04-15 MED ORDER — ALPRAZOLAM 0.5 MG PO TABS: 0.5000 mg | ORAL_TABLET | Freq: Every evening | ORAL | 1 refills | Status: DC | PRN

## 2018-04-15 NOTE — Telephone Encounter (Signed)
Pt is requesting refill on Xanax   LOV: 02/15/18  LRF:  02/11/18

## 2018-04-15 NOTE — Addendum Note (Signed)
Addended by: Shary Decamp B on: 04/15/2018 02:00 PM   Modules accepted: Orders

## 2018-04-15 NOTE — Telephone Encounter (Signed)
CB # [phone] Patient requesting a refill on her alprazolam called into [facility] on [address] In [place]. She states that she call them yesterday requesting a refill and they told her that they faxed it to [place] but hadn't heard back. She is currently at the pharmacy I advised her that Dr. Dennard Schaumann was seeing patients and I wasn't sure that it would be that quick of a turn around.

## 2018-04-29 ENCOUNTER — Other Ambulatory Visit: Payer: Self-pay

## 2018-04-29 ENCOUNTER — Encounter: Payer: Self-pay | Admitting: *Deleted

## 2018-04-29 ENCOUNTER — Ambulatory Visit: Payer: BLUE CROSS/BLUE SHIELD | Admitting: Gastroenterology

## 2018-04-29 ENCOUNTER — Encounter: Payer: Self-pay | Admitting: Gastroenterology

## 2018-04-29 ENCOUNTER — Telehealth: Payer: Self-pay | Admitting: *Deleted

## 2018-04-29 ENCOUNTER — Other Ambulatory Visit: Payer: Self-pay | Admitting: *Deleted

## 2018-04-29 VITALS — BP 130/76 | HR 66 | Temp 97.0°F | Ht 64.0 in | Wt 143.6 lb

## 2018-04-29 DIAGNOSIS — K5900 Constipation, unspecified: Secondary | ICD-10-CM | POA: Insufficient documentation

## 2018-04-29 DIAGNOSIS — K625 Hemorrhage of anus and rectum: Secondary | ICD-10-CM

## 2018-04-29 DIAGNOSIS — D509 Iron deficiency anemia, unspecified: Secondary | ICD-10-CM | POA: Diagnosis not present

## 2018-04-29 MED ORDER — PLECANATIDE 3 MG PO TABS: 3.0000 mg | ORAL_TABLET | Freq: Every day | ORAL | 5 refills | Status: DC

## 2018-04-29 NOTE — Telephone Encounter (Signed)
Called patient to advise about pre-op but VM is full. Letter mailed

## 2018-04-29 NOTE — Progress Notes (Addendum)
Primary Care Physician:  Susy Frizzle, MD  Primary Gastroenterologist:  Barney Drain, MD  REVIEWED-vomited [place]. [facility] CANCELED JUN 30. [facility] WITHIN THE NEXT TWO WEEKS WITH [facility].  Chief Complaint  Patient presents with  . Rectal Bleeding  . Constipation    HPI:  Lauren Harrison is a 63 y.o. female here at the request of Dr. Dennard Schaumann for further evaluation of constipation and blood in the stool.  She has never had a colonoscopy.  Fresh blood per rectum a few times. In the toilet and tissue. Sometimes little clot. BM daily, stools hard and strains. No abdominal. No rectal pain. Some discomfort with constipation. Occasionally severe constipation with vomiting. No heartburn, dysphagia, vomiting. No unintentional weight loss.  No family history of colon cancer.  In July 2019 she had mild iron deficiency anemia.  Labs have not been followed up on.   Current Outpatient Medications  Medication Sig Dispense Refill  . acetaminophen (TYLENOL) 325 MG tablet Take 325-650 mg by mouth every 6 (six) hours as needed.    . ALPRAZolam (XANAX) 0.5 MG tablet Take 1 tablet (0.5 mg total) by mouth at bedtime as needed. 30 tablet 1  . amLODipine (NORVASC) 5 MG tablet Take 1 tablet (5 mg total) by mouth daily. 90 tablet 3  . atorvastatin (LIPITOR) 20 MG tablet TAKE 1 TABLET BY MOUTH DAILY 90 tablet 1  . Halobetasol Prop-Tazarotene (DUOBRII) 0.01-0.045 % LOTN Apply topically.    Marland Kitchen losartan (COZAAR) 100 MG tablet TAKE 1 TABLET BY MOUTH DAILY 90 tablet 3   No current facility-administered medications for this visit.     Allergies as of 04/29/2018  . (No Known Allergies)    Past Medical History:  Diagnosis Date  . Anemia   . Anxiety   . Arthritis   . Full dentures   . HLD (hyperlipidemia)   . Hypertension   . Insomnia   . Psoriasis   . Smoker     Past Surgical History:  Procedure Laterality Date  . APPENDECTOMY    . BREAST LUMPECTOMY WITH RADIOACTIVE SEED LOCALIZATION Left 01/05/2014    Procedure: LEFT BREAST LUMPECTOMY WITH RADIOACTIVE SEED LOCALIZATION;  Surgeon: Fanny Skates, MD;  Location: [facility];  Service: General;  Laterality: Left;  . TUBAL LIGATION      Family History  Problem Relation Age of Onset  . Cancer Father   . Heart disease Sister   . Stroke Mother   . Heart disease Mother   . Colon cancer Neg Hx     Social History   Socioeconomic History  . Marital status: Married    Spouse name: Not on file  . Number of children: Not on file  . Years of education: Not on file  . Highest education level: Not on file  Occupational History  . Not on file  Social Needs  . Financial resource strain: Not on file  . Food insecurity:    Worry: Not on file    Inability: Not on file  . Transportation needs:    Medical: Not on file    Non-medical: Not on file  Tobacco Use  . Smoking status: Current Every Day Smoker    Packs/day: 1.00    Types: Cigarettes  . Smokeless tobacco: Never Used  Substance and Sexual Activity  . Alcohol use: Not Currently    Comment: Occasional  . Drug use: Yes    Types: Marijuana    Comment: "once in a blue moon"  . Sexual activity:  Yes    Birth control/protection: None  Lifestyle  . Physical activity:    Days per week: Not on file    Minutes per session: Not on file  . Stress: Not on file  Relationships  . Social connections:    Talks on phone: Not on file    Gets together: Not on file    Attends religious service: Not on file    Active member of club or organization: Not on file    Attends meetings of clubs or organizations: Not on file    Relationship status: Not on file  . Intimate partner violence:    Fear of current or ex partner: Not on file    Emotionally abused: Not on file    Physically abused: Not on file    Forced sexual activity: Not on file  Other Topics Concern  . Not on file  Social History Narrative  . Not on file      ROS:  General: Negative for anorexia, weight loss,  fever, chills, fatigue, weakness. Eyes: Negative for vision changes.  ENT: Negative for hoarseness, difficulty swallowing , nasal congestion. CV: Negative for chest pain, angina, palpitations, dyspnea on exertion, peripheral edema.  Respiratory: Negative for dyspnea at rest, dyspnea on exertion, cough, sputum, wheezing.  GI: See history of present illness. GU:  Negative for dysuria, hematuria, urinary incontinence, urinary frequency, nocturnal urination.  MS: Negative for joint pain, low back pain.  Derm: Negative for rash or itching.  Neuro: Negative for weakness, abnormal sensation, seizure, frequent headaches, memory loss, confusion.  Psych: Negative for anxiety, depression, suicidal ideation, hallucinations.  Endo: Negative for unusual weight change.  Heme: Negative for bruising or bleeding. Allergy: Negative for rash or hives.    Physical Examination:  BP 130/76   Pulse 66   Temp (!) 97 F (36.1 C) (Oral)   Ht 5\' 4"  (1.626 m)   Wt 143 lb 9.6 oz (65.1 kg)   BMI 24.65 kg/m    General: Well-nourished, well-developed in no acute distress.  Head: Normocephalic, atraumatic.   Eyes: Conjunctiva pink, no icterus. Mouth: Oropharyngeal mucosa moist and pink , no lesions erythema or exudate. Neck: Supple without thyromegaly, masses, or lymphadenopathy.  Lungs: Clear to auscultation bilaterally.  Heart: Regular rate and rhythm, no murmurs rubs or gallops.  Abdomen: Bowel sounds are normal, nontender, nondistended, no hepatosplenomegaly or masses, no abdominal bruits or    hernia , no rebound or guarding.   Rectal: not performed Extremities: No lower extremity edema. No clubbing or deformities.  Neuro: Alert and oriented x 4 , grossly normal neurologically.  Skin: Warm and dry, no rash or jaundice.   Psych: Alert and cooperative, normal mood and affect.  Labs: Lab Results  Component Value Date   CREATININE 1.07 (H) 08/13/2017   BUN 16 08/13/2017   NA 137 08/13/2017   K 4.1  08/13/2017   CL 101 08/13/2017   CO2 27 08/13/2017   Lab Results  Component Value Date   ALT 14 08/13/2017   AST 16 08/13/2017   ALKPHOS 106 06/26/2016   BILITOT 0.5 08/13/2017   Lab Results  Component Value Date   WBC 8.2 08/13/2017   HGB 11.1 (L) 08/13/2017   HCT 32.6 (L) 08/13/2017   MCV 88.1 08/13/2017   PLT 334 08/13/2017   Lab Results  Component Value Date   IRON 35 (L) 08/13/2017   No results found for: FERRITIN No results found for: VITAMINB12    Imaging Studies: No results  found.

## 2018-04-29 NOTE — Patient Instructions (Signed)
1. Please have blood work done to follow-up on your iron deficiency anemia. 2. Plan for colonoscopy around June once COVID-19 has settled down.  If you have increased rectal bleeding, abdominal pain please let me know in the interim. 3. Start Trulance 3 mg daily for constipation.  You may hold if you develop diarrhea.  Prescription sent to your pharmacy.

## 2018-04-29 NOTE — Progress Notes (Signed)
cc'ed to pcp °

## 2018-04-29 NOTE — Assessment & Plan Note (Signed)
Pleasant 63 year old female with no prior colonoscopy who presents for further evaluation of rectal bleeding, constipation.  In July 2019 she had mild iron deficiency anemia.  She reports hard stools, straining.  Fresh blood noted sometimes with small clots on the toilet tissue.  Denies any significant abdominal pain or rectal pain.  She inquired about Cologuard testing but this would not be appropriate in this particular setting.  Encouraged her to undergo colonoscopy.  She would like to wait a couple months given COVID-19 which we would recommend as well.  If she has any worsening bleeding or change in symptoms with [place] know.  Otherwise plan for colonoscopy with deep sedation in June.  I have discussed the risks, alternatives, benefits with regards to but not limited to the risk of reaction to medication, bleeding, infection, perforation and the patient is agreeable to proceed. Written consent to be obtained.  Start Trulance 3 mg daily.  Update labs.

## 2018-04-30 LAB — IRON,TIBC AND FERRITIN PANEL
%SAT: 18 % (calc) (ref 16–45)
Ferritin: 15 ng/mL — ABNORMAL LOW (ref 16–288)
Iron: 59 ug/dL (ref 45–160)
TIBC: 337 mcg/dL (calc) (ref 250–450)

## 2018-04-30 LAB — CBC
HCT: 37.2 % (ref 35.0–45.0)
Hemoglobin: 12.4 g/dL (ref 11.7–15.5)
MCH: 28.6 pg (ref 27.0–33.0)
MCHC: 33.3 g/dL (ref 32.0–36.0)
MCV: 85.7 fL (ref 80.0–100.0)
MPV: 10.6 fL (ref 7.5–12.5)
PLATELETS: 324 10*3/uL (ref 140–400)
RBC: 4.34 10*6/uL (ref 3.80–5.10)
RDW: 13.5 % (ref 11.0–15.0)
WBC: 6.6 10*3/uL (ref 3.8–10.8)

## 2018-05-02 ENCOUNTER — Ambulatory Visit: Payer: BLUE CROSS/BLUE SHIELD | Admitting: Gastroenterology

## 2018-05-02 NOTE — Progress Notes (Signed)
Called, mailbox full and could not leave a message.

## 2018-05-03 ENCOUNTER — Other Ambulatory Visit: Payer: Self-pay

## 2018-05-03 DIAGNOSIS — D509 Iron deficiency anemia, unspecified: Secondary | ICD-10-CM

## 2018-05-03 NOTE — Progress Notes (Signed)
Pt is aware. I am mailing her handout on dietary iron foods. Lab orders on file for 3 months.

## 2018-06-10 ENCOUNTER — Other Ambulatory Visit: Payer: Self-pay | Admitting: Family Medicine

## 2018-06-21 ENCOUNTER — Other Ambulatory Visit: Payer: Self-pay | Admitting: Family Medicine

## 2018-06-21 NOTE — Telephone Encounter (Signed)
Ok to refill??  Last office visit 02/15/2018.  Last refill 04/15/2018, #1 refill.

## 2018-06-29 ENCOUNTER — Other Ambulatory Visit: Payer: Self-pay

## 2018-06-29 DIAGNOSIS — D509 Iron deficiency anemia, unspecified: Secondary | ICD-10-CM

## 2018-07-15 LAB — CBC WITH DIFFERENTIAL/PLATELET
Absolute Monocytes: 390 cells/uL (ref 200–950)
Basophils Absolute: 32 cells/uL (ref 0–200)
Basophils Relative: 0.5 %
Eosinophils Absolute: 128 cells/uL (ref 15–500)
Eosinophils Relative: 2 %
HCT: 33.9 % — ABNORMAL LOW (ref 35.0–45.0)
Hemoglobin: 11.5 g/dL — ABNORMAL LOW (ref 11.7–15.5)
Lymphs Abs: 2042 cells/uL (ref 850–3900)
MCH: 29.6 pg (ref 27.0–33.0)
MCHC: 33.9 g/dL (ref 32.0–36.0)
MCV: 87.1 fL (ref 80.0–100.0)
MPV: 10.5 fL (ref 7.5–12.5)
Monocytes Relative: 6.1 %
Neutro Abs: 3808 cells/uL (ref 1500–7800)
Neutrophils Relative %: 59.5 %
Platelets: 287 10*3/uL (ref 140–400)
RBC: 3.89 10*6/uL (ref 3.80–5.10)
RDW: 13.4 % (ref 11.0–15.0)
Total Lymphocyte: 31.9 %
WBC: 6.4 10*3/uL (ref 3.8–10.8)

## 2018-07-15 LAB — FERRITIN: Ferritin: 18 ng/mL (ref 16–288)

## 2018-07-21 NOTE — Progress Notes (Signed)
Tried to call. VM not set up.

## 2018-07-21 NOTE — Progress Notes (Signed)
Pt called back and was informed. She said that she is having some rectal bleeding almost everyday since she was seen. She said it colors the water bright red.  She has not had any light headedness.  She wants to know if she should have her colonoscopy sooner.  Magda Paganini, please advise!

## 2018-07-22 ENCOUNTER — Other Ambulatory Visit: Payer: Self-pay | Admitting: Gastroenterology

## 2018-07-22 NOTE — Progress Notes (Signed)
PT called and was at pharmacy to pick up the ferrous sulfate. When I spelled it to her, she said she cannot take sulfate, that it makes her very sick. Magda Paganini, please advise!

## 2018-07-26 ENCOUNTER — Encounter (HOSPITAL_COMMUNITY)
Admission: RE | Admit: 2018-07-26 | Discharge: 2018-07-26 | Disposition: A | Discharge status: Home / Self Care | Payer: BLUE CROSS/BLUE SHIELD | Source: Ambulatory Visit | Attending: Gastroenterology | Admitting: Gastroenterology

## 2018-07-26 ENCOUNTER — Telehealth: Payer: Self-pay | Admitting: Gastroenterology

## 2018-07-26 ENCOUNTER — Other Ambulatory Visit: Payer: Self-pay

## 2018-07-26 NOTE — Telephone Encounter (Signed)
See result note, I have spoke to pt.

## 2018-07-26 NOTE — Telephone Encounter (Signed)
Routing to DS 

## 2018-07-26 NOTE — Telephone Encounter (Signed)
Pt said someone had tried calling her but the phone had a bad signal. Please call her at [phone] or [phone] on her husband's phone.

## 2018-07-26 NOTE — Progress Notes (Signed)
Pt is aware that after her procedure when she starts her iron, she may use any OTC iron per the instructions on the bottle.

## 2018-07-29 ENCOUNTER — Other Ambulatory Visit: Payer: Self-pay

## 2018-07-29 ENCOUNTER — Other Ambulatory Visit (HOSPITAL_COMMUNITY)
Admission: RE | Admit: 2018-07-29 | Discharge: 2018-07-29 | Disposition: A | Discharge status: Home / Self Care | Payer: BLUE CROSS/BLUE SHIELD | Source: Ambulatory Visit | Attending: Gastroenterology | Admitting: Gastroenterology

## 2018-08-01 ENCOUNTER — Other Ambulatory Visit: Payer: Self-pay

## 2018-08-01 ENCOUNTER — Other Ambulatory Visit (HOSPITAL_COMMUNITY)
Admission: RE | Admit: 2018-08-01 | Discharge: 2018-08-01 | Disposition: A | Discharge status: Home / Self Care | Payer: BLUE CROSS/BLUE SHIELD | Source: Ambulatory Visit | Attending: Gastroenterology | Admitting: Gastroenterology

## 2018-08-01 DIAGNOSIS — Z01812 Encounter for preprocedural laboratory examination: Secondary | ICD-10-CM | POA: Insufficient documentation

## 2018-08-01 DIAGNOSIS — Z1159 Encounter for screening for other viral diseases: Secondary | ICD-10-CM | POA: Insufficient documentation

## 2018-08-01 LAB — SARS CORONAVIRUS 2 BY RT PCR (HOSPITAL ORDER, PERFORMED IN ~~LOC~~ HOSPITAL LAB): SARS Coronavirus 2: NEGATIVE

## 2018-08-02 ENCOUNTER — Ambulatory Visit (HOSPITAL_COMMUNITY): Payer: BLUE CROSS/BLUE SHIELD | Admitting: Anesthesiology

## 2018-08-02 ENCOUNTER — Encounter (HOSPITAL_COMMUNITY)
Admission: RE | Disposition: A | Discharge status: Home / Self Care | Payer: Self-pay | Source: Home / Self Care | Attending: Gastroenterology

## 2018-08-02 ENCOUNTER — Other Ambulatory Visit: Payer: Self-pay

## 2018-08-02 ENCOUNTER — Telehealth: Payer: Self-pay | Admitting: *Deleted

## 2018-08-02 ENCOUNTER — Ambulatory Visit (HOSPITAL_COMMUNITY)
Admission: RE | Admit: 2018-08-02 | Discharge: 2018-08-02 | Disposition: A | Discharge status: Home / Self Care | Payer: BLUE CROSS/BLUE SHIELD | Attending: Gastroenterology | Admitting: Gastroenterology

## 2018-08-02 ENCOUNTER — Other Ambulatory Visit: Payer: Self-pay | Admitting: *Deleted

## 2018-08-02 DIAGNOSIS — K625 Hemorrhage of anus and rectum: Secondary | ICD-10-CM | POA: Insufficient documentation

## 2018-08-02 DIAGNOSIS — Z539 Procedure and treatment not carried out, unspecified reason: Secondary | ICD-10-CM | POA: Diagnosis not present

## 2018-08-02 SURGERY — COLONOSCOPY WITH PROPOFOL
Anesthesia: General

## 2018-08-02 MED ORDER — CHLORHEXIDINE GLUCONATE CLOTH 2 % EX PADS: 6.0000 | MEDICATED_PAD | Freq: Once | CUTANEOUS | Status: DC

## 2018-08-02 MED ORDER — MIDAZOLAM HCL 2 MG/2ML IJ SOLN
INTRAMUSCULAR | Status: AC
  Filled 2018-08-02: qty 2

## 2018-08-02 MED ORDER — PROPOFOL 10 MG/ML IV BOLUS
INTRAVENOUS | Status: AC
  Filled 2018-08-02: qty 20

## 2018-08-02 NOTE — OR Nursing (Signed)
1 liter bag tap water enema administered per rectum.  Brown solid stool expelled.  Patient tolerated fairly well.

## 2018-08-02 NOTE — OR Nursing (Signed)
Dr Oneida Alar spoke with patient.  She advised patient to return to her office and pick up another prep.  She advised patient that prep was not good enough to proceed with colonoscopy at this time.  Dr Oneida Alar advised patient that her procedure would be rescheduled for August 17, 2018 and have COVID 19 testing done 3 days prior to colonoscopy. I wrote these instructions for the patient.  I called patient's  husband at patient's request and told him he could pick up the patient now.  Patient verbalized understanding of the instructions given

## 2018-08-02 NOTE — Telephone Encounter (Signed)
Received call from [facility]. Patient is coming by office to pick up prep sample of plenvu and she needs to be r/s'd in the next 2 weeks. Advised opening on 7/14 at 8:30am. Patient scheduled. Instructions with pre-op appt is with prep sample to be picked up. Carolyn in endo will call patient to schedule COVID-19 testing. Orders will be entered once patient is d/c'd.

## 2018-08-02 NOTE — Anesthesia Preprocedure Evaluation (Deleted)
Anesthesia Evaluation  Patient identified by MRN, date of birth, ID band Patient awake    Reviewed: Allergy & Precautions, NPO status , Patient's Chart, lab work & pertinent test results  Airway Mallampati: I  TM Distance: >3 FB Neck ROM: Full    Dental no notable dental hx. (+) Edentulous Upper, Edentulous Lower   Pulmonary neg pulmonary ROS, Current Smoker,  Reports 1ppd for over 40 years  Denies inhalers or o2 use  Reports did smoke this am   Pulmonary exam normal breath sounds clear to auscultation       Cardiovascular Exercise Tolerance: Good hypertension, Pt. on medications negative cardio ROS Normal cardiovascular examI Rhythm:Regular Rate:Normal     Neuro/Psych Anxiety negative neurological ROS  negative psych ROS   GI/Hepatic negative GI ROS, Neg liver ROS,   Endo/Other  negative endocrine ROS  Renal/GU negative Renal ROS  negative genitourinary   Musculoskeletal  (+) Arthritis , Osteoarthritis,    Abdominal   Peds negative pediatric ROS (+)  Hematology negative hematology ROS (+) anemia , Chronic anemia per pt -no current Fe   Anesthesia Other Findings   Reproductive/Obstetrics negative OB ROS                             Anesthesia Physical Anesthesia Plan  ASA: II  Anesthesia Plan: General   Post-op Pain Management:    Induction: Intravenous  PONV Risk Score and Plan: 2 and Propofol infusion, Treatment may vary due to age or medical condition and Ondansetron  Airway Management Planned: Nasal Cannula and Simple Face Mask  Additional Equipment:   Intra-op Plan:   Post-operative Plan: Extubation in OR  Informed Consent: I have reviewed the patients History and Physical, chart, labs and discussed the procedure including the risks, benefits and alternatives for the proposed anesthesia with the patient or authorized representative who has indicated his/her  understanding and acceptance.     Dental advisory given  Plan Discussed with: CRNA  Anesthesia Plan Comments: (Plan Full PPE Plan GA with GETA as needed -d/w pt -WTP with same  Pt getting tap water enema because unable to finish prep - reports one Oz of [place] at 10 am  )        Anesthesia Quick Evaluation

## 2018-08-02 NOTE — Progress Notes (Signed)
cc'd to pcp 

## 2018-08-11 ENCOUNTER — Other Ambulatory Visit: Payer: Self-pay

## 2018-08-11 ENCOUNTER — Encounter (HOSPITAL_COMMUNITY)
Admit: 2018-08-11 | Discharge: 2018-08-11 | Disposition: A | Discharge status: Home / Self Care | Payer: BLUE CROSS/BLUE SHIELD | Attending: Gastroenterology | Admitting: Gastroenterology

## 2018-08-11 ENCOUNTER — Encounter (HOSPITAL_COMMUNITY): Payer: Self-pay

## 2018-08-11 ENCOUNTER — Other Ambulatory Visit (HOSPITAL_COMMUNITY)
Admission: RE | Admit: 2018-08-11 | Discharge: 2018-08-11 | Disposition: A | Discharge status: Home / Self Care | Payer: BLUE CROSS/BLUE SHIELD | Source: Ambulatory Visit | Attending: Gastroenterology | Admitting: Gastroenterology

## 2018-08-11 DIAGNOSIS — Z1159 Encounter for screening for other viral diseases: Secondary | ICD-10-CM | POA: Diagnosis not present

## 2018-08-11 DIAGNOSIS — Z01812 Encounter for preprocedural laboratory examination: Secondary | ICD-10-CM | POA: Insufficient documentation

## 2018-08-12 LAB — SARS CORONAVIRUS 2 (TAT 6-24 HRS): SARS Coronavirus 2: NEGATIVE

## 2018-08-15 NOTE — Progress Notes (Signed)
cc'd to pcp 

## 2018-08-16 ENCOUNTER — Encounter (HOSPITAL_COMMUNITY)
Admission: RE | Disposition: A | Discharge status: Home / Self Care | Payer: Self-pay | Source: Home / Self Care | Attending: Gastroenterology

## 2018-08-16 ENCOUNTER — Encounter (HOSPITAL_COMMUNITY): Payer: Self-pay | Admitting: *Deleted

## 2018-08-16 ENCOUNTER — Ambulatory Visit (HOSPITAL_COMMUNITY): Payer: BLUE CROSS/BLUE SHIELD | Admitting: Anesthesiology

## 2018-08-16 ENCOUNTER — Other Ambulatory Visit: Payer: Self-pay

## 2018-08-16 ENCOUNTER — Ambulatory Visit (HOSPITAL_COMMUNITY)
Admission: RE | Admit: 2018-08-16 | Discharge: 2018-08-16 | Disposition: A | Discharge status: Home / Self Care | Payer: BLUE CROSS/BLUE SHIELD | Attending: Gastroenterology | Admitting: Gastroenterology

## 2018-08-16 DIAGNOSIS — K5900 Constipation, unspecified: Secondary | ICD-10-CM | POA: Insufficient documentation

## 2018-08-16 DIAGNOSIS — D123 Benign neoplasm of transverse colon: Secondary | ICD-10-CM | POA: Insufficient documentation

## 2018-08-16 DIAGNOSIS — K635 Polyp of colon: Secondary | ICD-10-CM

## 2018-08-16 DIAGNOSIS — G4700 Insomnia, unspecified: Secondary | ICD-10-CM | POA: Diagnosis not present

## 2018-08-16 DIAGNOSIS — D124 Benign neoplasm of descending colon: Secondary | ICD-10-CM | POA: Diagnosis not present

## 2018-08-16 DIAGNOSIS — L409 Psoriasis, unspecified: Secondary | ICD-10-CM | POA: Diagnosis not present

## 2018-08-16 DIAGNOSIS — I10 Essential (primary) hypertension: Secondary | ICD-10-CM | POA: Diagnosis not present

## 2018-08-16 DIAGNOSIS — E785 Hyperlipidemia, unspecified: Secondary | ICD-10-CM | POA: Diagnosis not present

## 2018-08-16 DIAGNOSIS — F419 Anxiety disorder, unspecified: Secondary | ICD-10-CM | POA: Insufficient documentation

## 2018-08-16 DIAGNOSIS — D120 Benign neoplasm of cecum: Secondary | ICD-10-CM | POA: Diagnosis not present

## 2018-08-16 DIAGNOSIS — K648 Other hemorrhoids: Secondary | ICD-10-CM | POA: Diagnosis not present

## 2018-08-16 DIAGNOSIS — K625 Hemorrhage of anus and rectum: Secondary | ICD-10-CM

## 2018-08-16 DIAGNOSIS — Z79899 Other long term (current) drug therapy: Secondary | ICD-10-CM | POA: Insufficient documentation

## 2018-08-16 DIAGNOSIS — K921 Melena: Secondary | ICD-10-CM | POA: Diagnosis present

## 2018-08-16 DIAGNOSIS — Q438 Other specified congenital malformations of intestine: Secondary | ICD-10-CM | POA: Diagnosis not present

## 2018-08-16 DIAGNOSIS — F17210 Nicotine dependence, cigarettes, uncomplicated: Secondary | ICD-10-CM | POA: Diagnosis not present

## 2018-08-16 HISTORY — PX: COLONOSCOPY WITH PROPOFOL: SHX5780

## 2018-08-16 HISTORY — PX: POLYPECTOMY: SHX5525

## 2018-08-16 SURGERY — COLONOSCOPY WITH PROPOFOL
Anesthesia: General

## 2018-08-16 MED ORDER — KETAMINE HCL 10 MG/ML IJ SOLN
INTRAMUSCULAR | Status: DC | PRN
  Administered 2018-08-16 (×2): 10 mg via INTRAVENOUS

## 2018-08-16 MED ORDER — LACTATED RINGERS IV SOLN
INTRAVENOUS | Status: DC
  Administered 2018-08-16: 08:00:00 via INTRAVENOUS

## 2018-08-16 MED ORDER — PROPOFOL 10 MG/ML IV BOLUS: INTRAVENOUS | Status: DC | PRN

## 2018-08-16 MED ORDER — MIDAZOLAM HCL 2 MG/2ML IJ SOLN: 0.5000 mg | Freq: Once | INTRAMUSCULAR | Status: DC | PRN

## 2018-08-16 MED ORDER — KETAMINE HCL 50 MG/5ML IJ SOSY
PREFILLED_SYRINGE | INTRAMUSCULAR | Status: AC
  Filled 2018-08-16: qty 5

## 2018-08-16 MED ORDER — PROPOFOL 10 MG/ML IV BOLUS
INTRAVENOUS | Status: DC | PRN
  Administered 2018-08-16 (×2): 10 mg via INTRAVENOUS
  Administered 2018-08-16: 20 mg via INTRAVENOUS

## 2018-08-16 MED ORDER — PROMETHAZINE HCL 25 MG/ML IJ SOLN: 6.2500 mg | INTRAMUSCULAR | Status: DC | PRN

## 2018-08-16 MED ORDER — SPOT INK MARKER SYRINGE KIT
PACK | SUBMUCOSAL | Status: AC
  Filled 2018-08-16: qty 5

## 2018-08-16 MED ORDER — CHLORHEXIDINE GLUCONATE CLOTH 2 % EX PADS: 6.0000 | MEDICATED_PAD | Freq: Once | CUTANEOUS | Status: DC

## 2018-08-16 MED ORDER — HYDROCODONE-ACETAMINOPHEN 7.5-325 MG PO TABS: 1.0000 | ORAL_TABLET | Freq: Once | ORAL | Status: DC | PRN

## 2018-08-16 MED ORDER — SPOT INK MARKER SYRINGE KIT
PACK | SUBMUCOSAL | Status: DC | PRN
  Administered 2018-08-16: 1 mL via SUBMUCOSAL

## 2018-08-16 MED ORDER — PROPOFOL 500 MG/50ML IV EMUL
INTRAVENOUS | Status: DC | PRN
  Administered 2018-08-16: 150 ug/kg/min via INTRAVENOUS

## 2018-08-16 MED ORDER — LACTATED RINGERS IV SOLN: INTRAVENOUS | Status: DC

## 2018-08-16 MED ORDER — HYDROMORPHONE HCL 1 MG/ML IJ SOLN: 0.2500 mg | INTRAMUSCULAR | Status: DC | PRN

## 2018-08-16 NOTE — Addendum Note (Signed)
Addendum  created 08/16/18 1527 by Mickel Baas, CRNA   Clinical Note Signed, SmartForm saved

## 2018-08-16 NOTE — Addendum Note (Signed)
Addendum  created 08/16/18 1147 by Mickel Baas, CRNA   Intraprocedure Flowsheets edited

## 2018-08-16 NOTE — Op Note (Signed)
[facility] Patient Name: Lauren Harrison Procedure Date: 08/16/2018 8:33 AM MRN: [id number] Date of Birth: [birth year] Attending MD: Barney Drain MD, MD CSN: [id number] Age: 63 Admit Type: Outpatient Procedure:                Colonoscopy WITH COLD SNARE/COLD FORCEPS                            POLYPECTOMY, SUBMUCOSAL INJ Indications:              Hematochezia. PT ATE A TACO FOR LUNCH. PMHx:                            CONSTIPATION Providers:                Barney Drain MD, MD, Jeanann Lewandowsky. Sharon Seller, RN, Nelma Rothman, Technician Referring MD:             Cammie Mcgee. [place] Medicines:                Propofol per Anesthesia Complications:            No immediate complications. Estimated Blood Loss:     Estimated blood loss was minimal. Procedure:                Pre-Anesthesia Assessment:                           - Prior to the procedure, a History and Physical                            was performed, and patient medications and                            allergies were reviewed. The patient's tolerance of                            previous anesthesia was also reviewed. The risks                            and benefits of the procedure and the sedation                            options and risks were discussed with the patient.                            All questions were answered, and informed consent                            was obtained. Prior Anticoagulants: The patient has                            taken no previous anticoagulant or antiplatelet  agents. ASA Grade Assessment: II - A patient with                            mild systemic disease. After reviewing the risks                            and benefits, the patient was deemed in                            satisfactory condition to undergo the procedure.                            After obtaining informed consent, the colonoscope                            was passed under  direct vision. Throughout the                            procedure, the patient's blood pressure, pulse, and                            oxygen saturations were monitored continuously. The                            [id number]-[id number] ([id number]) scope was introduced through                            the anus and advanced to the 8 cm into the ileum.                            The colonoscopy was performed with difficulty due                            to poor bowel prep with stool present, restricted                            mobility of the colon and a redundant colon.                            Successful completion of the procedure was aided by                            increasing the dose of sedation medication,                            straightening and shortening the scope to obtain                            bowel loop reduction, lavage and COLOWRAP. The                            patient tolerated the procedure fairly well. The  quality of the bowel preparation was poor. The                            terminal ileum, ileocecal valve, appendiceal                            orifice, and rectum were photographed. Scope In: 8:41:36 AM Scope Out: 9:14:30 AM Scope Withdrawal Time: 0 hours 24 minutes 37 seconds  Total Procedure Duration: 0 hours 32 minutes 54 seconds  Findings:      The terminal ileum appeared normal.      Five sessile polyps were found in the splenic flexure, transverse colon,       hepatic flexure and cecum. The polyps were 5 to 6 mm in size. These       polyps were removed with a cold snare. Resection and retrieval were       complete.      A 8 mm polyp was found in the proximal descending colon. The polyp was       sessile. The polyp was removed with a cold biopsy forceps. The polyp was       removed with a cold snare. Resection and retrieval were complete. Area       was tattooed with an injection of 1 mL of Spot (carbon black).      External  and internal hemorrhoids were found. The hemorrhoids were large.      The recto-sigmoid colon, sigmoid colon, descending colon and transverse       colon were significantly redundant. Impression:               - Preparation of the colon was poor.                           - The examined portion of the ileum was normal.                           - Five 5 to 6 mm polyps at the splenic flexure, in                            the transverse colon(2), at the hepatic flexure and                            in the cecum, removed with a cold snare. Resected                            and retrieved.                           - One 8 mm polyp in the proximal descending colon,                            removed with a cold biopsy forceps and removed with                            a cold snare. Resected and retrieved. Tattooed.                           -  RECTAL BLEEDING DUE TO internal hemorrhoids.                           - Redundant LEFT colon. Moderate Sedation:      Per Anesthesia Care Recommendation:           - Patient has a contact number available for                            emergencies. The signs and symptoms of potential                            delayed complications were discussed with the                            patient. Return to normal activities tomorrow.                            Written discharge instructions were provided to the                            patient.                           - High fiber diet.                           - Continue present medications. PREPARATION H QID                            PRN RECTAL BLEEDING. CONSIDER SURGERY IF PT FAILS                            MEDICAL MANAGEMENT.                           - Await pathology results.                           - Repeat colonoscopy WITHIN 1 YEAR DUE to [place].Next colonoscopy                            WITHIN THE NEXT YEAR WITH THREE DAY BOWEL  PREP: 3                            DAYS BEFORE COLONOSCOPY- FOLLOW A SOFT MECHANICAL                            DIET AND TAKE MIRALAX 17 GMS PO Q1H FROM 12N TO                            6PM. DRINK 1 CUP OF LIQUID [place]  DOSE: 1230P TO 630PM. 2 DAYS BEFORE COLONOSCOPY:                            FOLLOW A FULL LIQUID DIET. TAKE MILK OF MAGNESIA                            CAPSULES 2 PO FOUR TIMES. 1 DAY BEFORE COLONOSCOPY:                            NO SOLID FOOD, CLEAR LIQUIDS ONLY AND START BOWEL                            PREP.                           - Return to GI office in 6 months. Procedure Code(s):        --- Professional ---                           [phone], Colonoscopy, flexible; with removal of                            tumor(s), polyp(s), or other lesion(s) by snare                            technique                           45381, Colonoscopy, flexible; with directed                            submucosal injection(s), any substance Diagnosis Code(s):        --- Professional ---                           K64.8, Other hemorrhoids                           K63.5, Polyp of colon                           K92.1, Melena (includes Hematochezia)                           Q43.8, Other specified congenital malformations of                            intestine CPT copyright 2019 [employer]. All rights reserved. The codes documented in this report are preliminary and upon coder review may  be revised to meet current compliance requirements. Barney Drain, MD Barney Drain MD, MD 08/16/2018 9:36:55 AM This report has been signed electronically. Number of Addenda: 0

## 2018-08-16 NOTE — Addendum Note (Signed)
Addendum  created 08/16/18 1616 by Mickel Baas, CRNA   Clinical Note Signed

## 2018-08-16 NOTE — Addendum Note (Signed)
Addendum  created 08/16/18 1148 by Mickel Baas, CRNA   Intraprocedure Flowsheets edited

## 2018-08-16 NOTE — Anesthesia Postprocedure Evaluation (Signed)
Anesthesia Post Note  Patient: Lauren Harrison  Procedure(s) Performed: COLONOSCOPY WITH PROPOFOL (N/A ) POLYPECTOMY  Patient location during evaluation: PACU Anesthesia Type: MAC Level of consciousness: awake and alert and oriented Pain management: pain level controlled Vital Signs Assessment: post-procedure vital signs reviewed and stable Respiratory status: spontaneous breathing and respiratory function stable Cardiovascular status: stable Postop Assessment: no apparent nausea or vomiting Anesthetic complications: no     Last Vitals:  Vitals:   08/16/18 0732 08/16/18 0920  BP: 133/62 96/78  Pulse: 67 82  Resp: (!) 21 19  Temp: 36.9 C (P) 36.8 C  SpO2: 90% 98%    Last Pain:  Vitals:   08/16/18 0834  TempSrc:   PainSc: 0-No pain                 ADAMS, AMY A

## 2018-08-16 NOTE — H&P (Signed)
Primary Care Physician:  Susy Frizzle, MD Primary Gastroenterologist:  Dr. Oneida Alar  Pre-Procedure History & Physical: HPI:  Lauren Harrison is a 63 y.o. female here for  RECTAL BLEEDING.  Past Medical History:  Diagnosis Date  . Anemia   . Anxiety   . Arthritis   . Full dentures   . HLD (hyperlipidemia)   . Hypertension   . Insomnia   . Psoriasis   . Smoker     Past Surgical History:  Procedure Laterality Date  . APPENDECTOMY    . BREAST LUMPECTOMY WITH RADIOACTIVE SEED LOCALIZATION Left 01/05/2014   Procedure: LEFT BREAST LUMPECTOMY WITH RADIOACTIVE SEED LOCALIZATION;  Surgeon: Fanny Skates, MD;  Location: [facility];  Service: General;  Laterality: Left;  . TUBAL LIGATION      Prior to Admission medications   Medication Sig Start Date End Date Taking? Authorizing Provider  acetaminophen (TYLENOL) 325 MG tablet Take 325 mg by mouth daily as needed for moderate pain or headache.    Yes [provider]  ALPRAZolam (XANAX) 0.5 MG tablet TAKE 1 TABLET(0.5 MG) BY MOUTH AT BEDTIME AS NEEDED Patient taking differently: Take 0.5 mg by mouth at bedtime as needed for sleep.  06/21/18  Yes Susy Frizzle, MD  amLODipine (NORVASC) 5 MG tablet Take 1 tablet (5 mg total) by mouth daily. 09/03/17  Yes Susy Frizzle, MD  atorvastatin (LIPITOR) 20 MG tablet TAKE 1 TABLET BY MOUTH DAILY Patient taking differently: Take 20 mg by mouth every evening.  06/10/18  Yes Susy Frizzle, MD  Halobetasol Prop-Tazarotene (DUOBRII) 0.01-0.045 % LOTN Apply 1 application topically daily as needed (psoriasis).   Yes [provider]  losartan (COZAAR) 100 MG tablet TAKE 1 TABLET BY MOUTH DAILY Patient taking differently: Take 100 mg by mouth daily.  11/15/17  Yes PickardCammie Mcgee, MD  Plecanatide (TRULANCE) 3 MG TABS Take 3 mg by mouth daily. Patient not taking: Reported on 07/21/2018 04/29/18   Mahala Menghini, PA-C    Allergies as of 08/02/2018 - Review Complete  08/02/2018  Allergen Reaction Noted  . Sulfa antibiotics Nausea Only 08/02/2018  . Suprep [na sulfate-k sulfate-mg sulf]  08/02/2018    Family History  Problem Relation Age of Onset  . Cancer Father   . Heart disease Sister   . Stroke Mother   . Heart disease Mother   . Colon cancer Neg Hx     Social History   Socioeconomic History  . Marital status: Married    Spouse name: Not on file  . Number of children: Not on file  . Years of education: Not on file  . Highest education level: Not on file  Occupational History  . Not on file  Social Needs  . Financial resource strain: Not on file  . Food insecurity    Worry: Not on file    Inability: Not on file  . Transportation needs    Medical: Not on file    Non-medical: Not on file  Tobacco Use  . Smoking status: Current Every Day Smoker    Packs/day: 1.00    Types: Cigarettes  . Smokeless tobacco: Never Used  Substance and Sexual Activity  . Alcohol use: Not Currently    Comment: Occasional  . Drug use: Yes    Types: Marijuana    Comment: "once in a blue moon"  . Sexual activity: Yes    Birth control/protection: None  Lifestyle  . Physical activity  Days per week: Not on file    Minutes per session: Not on file  . Stress: Not on file  Relationships  . Social Herbalist on phone: Not on file    Gets together: Not on file    Attends religious service: Not on file    Active member of club or organization: Not on file    Attends meetings of clubs or organizations: Not on file    Relationship status: Not on file  . Intimate partner violence    Fear of current or ex partner: Not on file    Emotionally abused: Not on file    Physically abused: Not on file    Forced sexual activity: Not on file  Other Topics Concern  . Not on file  Social History Narrative  . Not on file    Review of Systems: See HPI, otherwise negative ROS   Physical Exam: BP 133/62   Pulse 67   Temp 98.4 F (36.9 C) (Oral)    Resp (!) 21   Ht 5\' 4"  (1.626 m)   Wt 64.4 kg   SpO2 90%   BMI 24.37 kg/m  General:   Alert,  pleasant and cooperative in NAD Head:  Normocephalic and atraumatic. Neck:  Supple; Lungs:  Clear throughout to auscultation.    Heart:  Regular rate and rhythm. Abdomen:  Soft, nontender and nondistended. Normal bowel sounds, without guarding, and without rebound.   Neurologic:  Alert and  oriented x4;  grossly normal neurologically.  Impression/Plan:    BRBPR  PLAN: TCS TODAY. DISCUSSED PROCEDURE, BENEFITS, & RISKS: < 1% chance of medication reaction, bleeding, perforation, ASPIRATION, or rupture of spleen/liver requiring surgery to fix it and missed polyps < 1 cm 10-20% of the time.

## 2018-08-16 NOTE — Anesthesia Preprocedure Evaluation (Addendum)
Anesthesia Evaluation  Patient identified by MRN, date of birth, ID band Patient awake    Reviewed: Allergy & Precautions, H&P , NPO status , Patient's Chart, lab work & pertinent test results, reviewed documented beta blocker date and time   History of Anesthesia Complications (+) DIFFICULT AIRWAY  Airway        Dental   Pulmonary Current Smoker,           Cardiovascular hypertension,      Neuro/Psych Anxiety    GI/Hepatic   Endo/Other    Renal/GU      Musculoskeletal   Abdominal   Peds  Hematology  (+) Blood dyscrasia, anemia ,   Anesthesia Other Findings   Reproductive/Obstetrics negative OB ROS                            Anesthesia Physical Anesthesia Plan  ASA: III  Anesthesia Plan: General   Post-op Pain Management:    Induction:   PONV Risk Score and Plan: TIVA  Airway Management Planned:   Additional Equipment:   Intra-op Plan:   Post-operative Plan:   Informed Consent: I have reviewed the patients History and Physical, chart, labs and discussed the procedure including the risks, benefits and alternatives for the proposed anesthesia with the patient or authorized representative who has indicated his/her understanding and acceptance.       Plan Discussed with: CRNA  Anesthesia Plan Comments:        Anesthesia Quick Evaluation

## 2018-08-16 NOTE — Anesthesia Procedure Notes (Signed)
Procedure Name: MAC Date/Time: 08/16/2018 8:30 AM Performed by: Andree Elk Amy A, CRNA Pre-anesthesia Checklist: Patient identified, Emergency Drugs available, Suction available, Timeout performed and Patient being monitored Patient Re-evaluated:Patient Re-evaluated prior to induction Oxygen Delivery Method: Non-rebreather mask

## 2018-08-16 NOTE — Discharge Instructions (Signed)
You had SIX small polyp removed. YOUR PREP WAS POOR AND YOU WILL NEED TO COME BACK TO CLEAR YOUR COLON OF POLYPS. You have  SMALL internal hemorrhoids.   DRINK WATER TO KEEP YOUR URINE LIGHT YELLOW.  FOLLOW A HIGH FIBER DIET. AVOID ITEMS THAT CAUSE BLOATING & GAS. SEE INFO BELOW.  USE PREPARATION H FOUR TIMES  A DAY WHEN NEEDED TO RELIEVE RECTAL PAIN/PRESSURE/BLEEDING.   YOUR BIOPSY RESULTS WILL BE BACK IN 5 BUSINESS DAYS.  FOLLOW UP IN 6 MOS.   Next colonoscopy WITHIN THE NEXT YEAR WITH THREE DAY BOWEL PREP: 3 DAYS BEFORE COLONOSCOPY- FOLLOW A SOFT MECHANICAL DIET AND  TAKE MIRALAX 17 GMS PO Q1H FROM 12N TO 6PM. DRINK 1 CUP OF LIQUID 30 MINUTES AFTER EACH DOSE: 1230P TO 630PM. 2 DAYS BEFORE COLONOSCOPY: FOLLOW A FULL LIQUID DIET. TAKE MILK OF MAGNESIA CAPSULES 2 PO FOUR TIMES. 1 DAY BEFORE COLONOSCOPY: NO SOLID FOOD, CLEAR LIQUIDS ONLY AND START BOWEL PREP.   Colonoscopy Care After Read the instructions outlined below and refer to this sheet in the next week. These discharge instructions provide you with general information on caring for yourself after you leave the hospital. While your treatment has been planned according to the most current medical practices available, unavoidable complications occasionally occur. If you have any problems or questions after discharge, call DR. Jiovanna Frei, [phone].  ACTIVITY  You may resume your regular activity, but move at a slower pace for the next 24 hours.   Take frequent rest periods for the next 24 hours.   Walking will help get rid of the air and reduce the bloated feeling in your belly (abdomen).   No driving for 24 hours (because of the medicine (anesthesia) used during the test).   You may shower.   Do not sign any important legal documents or operate any machinery for 24 hours (because of the anesthesia used during the test).    NUTRITION  Drink plenty of fluids.   You may resume your normal diet as instructed by your doctor.     Begin with a light meal and progress to your normal diet. Heavy or fried foods are harder to digest and may make you feel sick to your stomach (nauseated).   Avoid alcoholic beverages for 24 hours or as instructed.    MEDICATIONS  You may resume your normal medications.   WHAT YOU CAN EXPECT TODAY  Some feelings of bloating in the abdomen.   Passage of more gas than usual.   Spotting of blood in your stool or on the toilet paper  .  IF YOU HAD POLYPS REMOVED DURING THE COLONOSCOPY:  Eat a soft diet IF YOU HAVE NAUSEA, BLOATING, ABDOMINAL PAIN, OR VOMITING.    FINDING OUT THE RESULTS OF YOUR TEST Not all test results are available during your visit. DR. Oneida Alar WILL CALL YOU WITHIN 14 DAYS OF YOUR PROCEDUE WITH YOUR RESULTS. Do not assume everything is normal if you have not heard from DR. Vinay Ertl, CALL HER OFFICE AT [phone].  SEEK IMMEDIATE MEDICAL ATTENTION AND CALL THE OFFICE: [phone] IF:  You have more than a spotting of blood in your stool.   Your belly is swollen (abdominal distention).   You are nauseated or vomiting.   You have a temperature over 101F.   You have abdominal pain or discomfort that is severe or gets worse throughout the day.   High-Fiber Diet A high-fiber diet changes your normal diet to include more whole grains, legumes, fruits,  and vegetables. Changes in the diet involve replacing refined carbohydrates with unrefined foods. The calorie level of the diet is essentially unchanged. The Dietary Reference Intake (recommended amount) for adult males is 38 grams per day. For adult females, it is 25 grams per day. Pregnant and lactating women should consume 28 grams of fiber per day. Fiber is the intact part of a plant that is not broken down during digestion. Functional fiber is fiber that has been isolated from the plant to provide a beneficial effect in the body. PURPOSE  Increase stool bulk.   Ease and regulate bowel movements.   Lower  cholesterol.   REDUCE RISK OF COLON CANCER  INDICATIONS THAT YOU NEED MORE FIBER  Constipation and hemorrhoids.   Uncomplicated diverticulosis (intestine condition) and irritable bowel syndrome.   Weight management.   As a protective measure against hardening of the arteries (atherosclerosis), diabetes, and cancer.   GUIDELINES FOR INCREASING FIBER IN THE DIET  Start adding fiber to the diet slowly. A gradual increase of about 5 more grams (2 slices of whole-wheat bread, 2 servings of most fruits or vegetables, or 1 bowl of high-fiber cereal) per day is best. Too rapid an increase in fiber may result in constipation, flatulence, and bloating.   Drink enough water and fluids to keep your urine clear or pale yellow. Water, juice, or caffeine-free drinks are recommended. Not drinking enough fluid may cause constipation.   Eat a variety of high-fiber foods rather than one type of fiber.   Try to increase your intake of fiber through using high-fiber foods rather than fiber pills or supplements that contain small amounts of fiber.   The goal is to change the types of food eaten. Do not supplement your present diet with high-fiber foods, but replace foods in your present diet.   INCLUDE A VARIETY OF FIBER SOURCES  Replace refined and processed grains with whole grains, canned fruits with fresh fruits, and incorporate other fiber sources. White rice, white breads, and most bakery goods contain little or no fiber.   Brown whole-grain rice, buckwheat oats, and many fruits and vegetables are all good sources of fiber. These include: broccoli, Brussels sprouts, cabbage, cauliflower, beets, sweet potatoes, white potatoes (skin on), carrots, tomatoes, eggplant, squash, berries, fresh fruits, and dried fruits.   Cereals appear to be the richest source of fiber. Cereal fiber is found in whole grains and bran. Bran is the fiber-rich outer coat of cereal grain, which is largely removed in refining. In  whole-grain cereals, the bran remains. In breakfast cereals, the largest amount of fiber is found in those with "bran" in their names. The fiber content is sometimes indicated on the label.   You may need to include additional fruits and vegetables each day.   In baking, for 1 cup white flour, you may use the following substitutions:   1 cup whole-wheat flour minus 2 tablespoons.   1/2 cup white flour plus 1/2 cup whole-wheat flour.   Polyps, Colon  A polyp is extra tissue that grows inside your body. Colon polyps grow in the large intestine. The large intestine, also called the colon, is part of your digestive system. It is a long, hollow tube at the end of your digestive tract where your body makes and stores stool. Most polyps are not dangerous. They are benign. This means they are not cancerous. But over time, some types of polyps can turn into cancer. Polyps that are smaller than a pea are usually not harmful.  But larger polyps could someday become or may already be cancerous. To be safe, doctors remove all polyps and test them.   WHO GETS POLYPS? Anyone can get polyps, but certain people are more likely than others. You may have a greater chance of getting polyps if:  You are over 50.   You have had polyps before.   Someone in your family has had polyps.   Someone in your family has had cancer of the large intestine.   Find out if someone in your family has had polyps. You may also be more likely to get polyps if you:   Eat a lot of fatty foods   Smoke   Drink alcohol   Do not exercise  Eat too much   PREVENTION There is not one sure way to prevent polyps. You might be able to lower your risk of getting them if you:  Eat more fruits and vegetables and less fatty food.   Do not smoke.   Avoid alcohol.   Exercise every day.   Lose weight if you are overweight.   Eating more calcium and folate can also lower your risk of getting polyps. Some foods that are rich in  calcium are milk, cheese, and broccoli. Some foods that are rich in folate are chickpeas, kidney beans, and spinach.   Hemorrhoids Hemorrhoids are dilated (enlarged) veins around the rectum. Sometimes clots will form in the veins. This makes them swollen and painful. These are called thrombosed hemorrhoids. Causes of hemorrhoids include:  Constipation.   Straining to have a bowel movement.   HEAVY LIFTING  HOME CARE INSTRUCTIONS  Eat a well balanced diet and drink 6 to 8 glasses of water every day to avoid constipation. You may also use a bulk laxative.   Avoid straining to have bowel movements.   Keep anal area dry and clean.   Do not use a donut shaped pillow or sit on the toilet for long periods. This increases blood pooling and pain.   Move your bowels when your body has the urge; this will require less straining and will decrease pain and pressure.

## 2018-08-16 NOTE — Transfer of Care (Signed)
Immediate Anesthesia Transfer of Care Note  Patient: Lauren Harrison  Procedure(s) Performed: COLONOSCOPY WITH PROPOFOL (N/A ) POLYPECTOMY  Patient Location: PACU  Anesthesia Type:MAC  Level of Consciousness: awake, alert , oriented and patient cooperative  Airway & Oxygen Therapy: Patient Spontanous Breathing  Post-op Assessment: Report given to RN and Post -op Vital signs reviewed and stable  Post vital signs: Reviewed and stable  Last Vitals:  Vitals Value Taken Time  BP 96/78 08/16/18 0920  Temp    Pulse 82 08/16/18 0922  Resp 14 08/16/18 0922  SpO2 97 % 08/16/18 0922  Vitals shown include unvalidated device data.  Last Pain:  Vitals:   08/16/18 0834  TempSrc:   PainSc: 0-No pain      Patients Stated Pain Goal: 6 (79/02/40 9735)  Complications: No apparent anesthesia complications

## 2018-08-18 ENCOUNTER — Telehealth: Payer: Self-pay | Admitting: Gastroenterology

## 2018-08-18 NOTE — Telephone Encounter (Signed)
OV made °

## 2018-08-18 NOTE — Telephone Encounter (Signed)
Please call pt. She had SIX simple adenomas removed. YOUR PREP WAS POOR AND YOU WILL NEED TO COME BACK TO CLEAR YOUR COLON OF POLYPS.   DRINK WATER TO KEEP YOUR URINE LIGHT YELLOW.  FOLLOW A HIGH FIBER DIET. AVOID ITEMS THAT CAUSE BLOATING & GAS.   USE PREPARATION H FOUR TIMES  A DAY WHEN NEEDED TO RELIEVE RECTAL PAIN/PRESSURE/BLEEDING.   FOLLOW UP IN 6 MOS.   Next colonoscopy WITHIN THE NEXT 6 MOS TO ONE YEAR WITH THREE DAY BOWEL PREP: 3 DAYS BEFORE COLONOSCOPY- FOLLOW A SOFT MECHANICAL DIET AND  TAKE MIRALAX 17 GMS PO Q1H FROM 12N TO 6PM. DRINK 1 CUP OF LIQUID 30 MINUTES AFTER EACH DOSE: 1230P TO 630PM. 2 DAYS BEFORE COLONOSCOPY: FOLLOW A FULL LIQUID DIET. TAKE MILK OF MAGNESIA CAPSULES 2 PO FOUR TIMES. 1 DAY BEFORE COLONOSCOPY: NO SOLID FOOD, CLEAR LIQUIDS ONLY AND START BOWEL PREP.

## 2018-08-18 NOTE — Telephone Encounter (Signed)
Pt notified of results. Pt will f/u in 6 months as directed. Apt has already was previously made for 02/2019.

## 2018-08-19 ENCOUNTER — Encounter (HOSPITAL_COMMUNITY): Payer: Self-pay | Admitting: Gastroenterology

## 2018-09-04 ENCOUNTER — Other Ambulatory Visit: Payer: Self-pay | Admitting: Family Medicine

## 2018-09-05 NOTE — Telephone Encounter (Signed)
Pt is requesting refill on Xanax   LOV: 02/15/18  LRF:   06/21/18

## 2018-10-28 ENCOUNTER — Other Ambulatory Visit: Payer: Self-pay

## 2018-10-28 ENCOUNTER — Other Ambulatory Visit: Payer: Self-pay | Admitting: Family Medicine

## 2018-10-28 MED ORDER — LOSARTAN POTASSIUM 100 MG PO TABS: 100.0000 mg | ORAL_TABLET | Freq: Every day | ORAL | 0 refills | Status: DC

## 2018-11-02 ENCOUNTER — Other Ambulatory Visit: Payer: Self-pay

## 2018-11-03 ENCOUNTER — Encounter: Payer: Self-pay | Admitting: Family Medicine

## 2018-11-03 ENCOUNTER — Ambulatory Visit (INDEPENDENT_AMBULATORY_CARE_PROVIDER_SITE_OTHER): Payer: BLUE CROSS/BLUE SHIELD | Admitting: Family Medicine

## 2018-11-03 VITALS — BP 132/78 | HR 74 | Temp 98.2°F | Resp 16 | Ht 64.0 in | Wt 139.0 lb

## 2018-11-03 DIAGNOSIS — E7800 Pure hypercholesterolemia, unspecified: Secondary | ICD-10-CM | POA: Diagnosis not present

## 2018-11-03 DIAGNOSIS — Z1231 Encounter for screening mammogram for malignant neoplasm of breast: Secondary | ICD-10-CM | POA: Diagnosis not present

## 2018-11-03 NOTE — Progress Notes (Signed)
Subjective:    Patient ID: Lauren Harrison, female    DOB: 02-Mar-1955, 63 y.o.   MRN: [id number]  HPI Patient is here today for a checkup of her chronic medical conditions.  She has a history of hypertension.  Blood pressure today is well controlled.  She denies any chest pain, shortness of breath, dyspnea on exertion.  She is also due to recheck her hyperlipidemia.  She denies any myalgias or right upper quadrant pain on Lipitor.  She is long overdue for a mammogram.  She hesitantly agrees to allow me to schedule her for mammogram.  Her colonoscopy is up-to-date.  However she is due for a Pap smear.  We discussed this at length and I have recommended that she schedule a complete physical exam here so that I can perform her Pap smear also.  Patient declines HIV and hepatitis C screening. Past Medical History:  Diagnosis Date   Anemia    Anxiety    Arthritis    Full dentures    HLD (hyperlipidemia)    Hypertension    Insomnia    Psoriasis    Smoker    Past Surgical History:  Procedure Laterality Date   APPENDECTOMY     BREAST LUMPECTOMY WITH RADIOACTIVE SEED LOCALIZATION Left 01/05/2014   Procedure: LEFT BREAST LUMPECTOMY WITH RADIOACTIVE SEED LOCALIZATION;  Surgeon: Fanny Skates, MD;  Location: [facility];  Service: General;  Laterality: Left;   COLONOSCOPY WITH PROPOFOL N/A 08/16/2018   Procedure: COLONOSCOPY WITH PROPOFOL;  Surgeon: Danie Binder, MD;  Location: [facility];  Service: Endoscopy;  Laterality: N/A;  8:30am   POLYPECTOMY  08/16/2018   Procedure: POLYPECTOMY;  Surgeon: Danie Binder, MD;  Location: [facility];  Service: Endoscopy;;  colon   TUBAL LIGATION     Current Outpatient Medications on File Prior to Visit  Medication Sig Dispense Refill   acetaminophen (TYLENOL) 325 MG tablet Take 325 mg by mouth daily as needed for moderate pain or headache.      ALPRAZolam (XANAX) 0.5 MG tablet TAKE 1 TABLET(0.5 MG) BY MOUTH AT BEDTIME AS  NEEDED 30 tablet 0   amLODipine (NORVASC) 5 MG tablet TAKE 1 TABLET(5 MG) BY MOUTH DAILY 90 tablet 3   atorvastatin (LIPITOR) 20 MG tablet TAKE 1 TABLET BY MOUTH DAILY (Patient taking differently: Take 20 mg by mouth every evening. ) 90 tablet 1   losartan (COZAAR) 100 MG tablet TAKE 1 TABLET(100 MG) BY MOUTH DAILY 90 tablet 0   No current facility-administered medications on file prior to visit.    Allergies  Allergen Reactions   Sulfa Antibiotics Nausea Only   Suprep [Na Sulfate-K Sulfate-Mg Sulf]     VOMITING   Social History   Socioeconomic History   Marital status: Married    Spouse name: Not on file   Number of children: Not on file   Years of education: Not on file   Highest education level: Not on file  Occupational History   Not on file  Social Needs   Financial resource strain: Not on file   Food insecurity    Worry: Not on file    Inability: Not on file   Transportation needs    Medical: Not on file    Non-medical: Not on file  Tobacco Use   Smoking status: Current Every Day Smoker    Packs/day: 1.00    Types: Cigarettes   Smokeless tobacco: Never Used  Substance and Sexual Activity  Alcohol use: Not Currently    Comment: Occasional   Drug use: Yes    Types: Marijuana    Comment: "once in a blue moon"   Sexual activity: Yes    Birth control/protection: None  Lifestyle   Physical activity    Days per week: Not on file    Minutes per session: Not on file   Stress: Not on file  Relationships   Social connections    Talks on phone: Not on file    Gets together: Not on file    Attends religious service: Not on file    Active member of club or organization: Not on file    Attends meetings of clubs or organizations: Not on file    Relationship status: Not on file   Intimate partner violence    Fear of current or ex partner: Not on file    Emotionally abused: Not on file    Physically abused: Not on file    Forced sexual activity:  Not on file  Other Topics Concern   Not on file  Social History Narrative   Not on file     Review of Systems  All other systems reviewed and are negative.      Objective:   Physical Exam Vitals signs reviewed.  Constitutional:      General: She is not in acute distress.    Appearance: She is well-developed. She is not diaphoretic.  Cardiovascular:     Rate and Rhythm: Normal rate and regular rhythm.     Heart sounds: Normal heart sounds. No murmur. No friction rub. No gallop.   Pulmonary:     Effort: Pulmonary effort is normal. No respiratory distress.     Breath sounds: Normal breath sounds. No stridor. No wheezing or rales.  Chest:     Chest wall: No tenderness.  Abdominal:     General: Bowel sounds are normal. There is no distension.     Palpations: Abdomen is soft. There is no mass.     Tenderness: There is no abdominal tenderness. There is no guarding or rebound.  Neurological:     Mental Status: She is alert and oriented to person, place, and time.     Cranial Nerves: No cranial nerve deficit.     Sensory: No sensory deficit.     Motor: No abnormal muscle tone.     Coordination: Coordination normal.     Deep Tendon Reflexes: Reflexes normal.           Assessment & Plan:  Pure hypercholesterolemia - Plan: CBC with Differential/Platelet, COMPLETE METABOLIC PANEL WITH GFR, Lipid panel  Breast cancer screening by mammogram - Plan: MM Digital Screening  Blood pressure today is well controlled at 132/78.  I will not change the amlodipine or losartan that she takes.  Check CMP, fasting lipid panel to monitor her hyperlipidemia.  Goal LDL cholesterol is less than 100.  Continue to encourage the patient to schedule her Pap smear.  We will schedule the patient for a mammogram.  Patient declines HIV and hepatitis C screening.  Recommended the patient schedule a physical exam

## 2018-11-04 LAB — CBC WITH DIFFERENTIAL/PLATELET
Absolute Monocytes: 446 cells/uL (ref 200–950)
Basophils Absolute: 50 cells/uL (ref 0–200)
Basophils Relative: 0.8 %
Eosinophils Absolute: 149 cells/uL (ref 15–500)
Eosinophils Relative: 2.4 %
HCT: 40 % (ref 35.0–45.0)
Hemoglobin: 13.1 g/dL (ref 11.7–15.5)
Lymphs Abs: 2294 cells/uL (ref 850–3900)
MCH: 28.8 pg (ref 27.0–33.0)
MCHC: 32.8 g/dL (ref 32.0–36.0)
MCV: 87.9 fL (ref 80.0–100.0)
MPV: 10.6 fL (ref 7.5–12.5)
Monocytes Relative: 7.2 %
Neutro Abs: 3261 cells/uL (ref 1500–7800)
Neutrophils Relative %: 52.6 %
Platelets: 312 10*3/uL (ref 140–400)
RBC: 4.55 10*6/uL (ref 3.80–5.10)
RDW: 13.4 % (ref 11.0–15.0)
Total Lymphocyte: 37 %
WBC: 6.2 10*3/uL (ref 3.8–10.8)

## 2018-11-04 LAB — COMPLETE METABOLIC PANEL WITH GFR
AG Ratio: 1.7 (calc) (ref 1.0–2.5)
ALT: 33 U/L — ABNORMAL HIGH (ref 6–29)
AST: 28 U/L (ref 10–35)
Albumin: 4.5 g/dL (ref 3.6–5.1)
Alkaline phosphatase (APISO): 122 U/L (ref 37–153)
BUN: 11 mg/dL (ref 7–25)
CO2: 28 mmol/L (ref 20–32)
Calcium: 10.4 mg/dL (ref 8.6–10.4)
Chloride: 103 mmol/L (ref 98–110)
Creat: 0.88 mg/dL (ref 0.50–0.99)
GFR, Est African American: 82 mL/min/{1.73_m2} (ref >=60)
GFR, Est Non African American: 70 mL/min/{1.73_m2} (ref >=60)
Globulin: 2.7 g/dL (calc) (ref 1.9–3.7)
Glucose, Bld: 100 mg/dL — ABNORMAL HIGH (ref 65–99)
Potassium: 4.9 mmol/L (ref 3.5–5.3)
Sodium: 139 mmol/L (ref 135–146)
Total Bilirubin: 0.6 mg/dL (ref 0.2–1.2)
Total Protein: 7.2 g/dL (ref 6.1–8.1)

## 2018-11-04 LAB — LIPID PANEL
Cholesterol: 136 mg/dL (ref <200)
HDL: 43 mg/dL — ABNORMAL LOW (ref >=50)
LDL Cholesterol (Calc): 70 mg/dL (calc)
Non-HDL Cholesterol (Calc): 93 mg/dL (calc) (ref <130)
Total CHOL/HDL Ratio: 3.2 (calc) (ref <5.0)
Triglycerides: 143 mg/dL (ref <150)

## 2018-11-17 ENCOUNTER — Other Ambulatory Visit: Payer: Self-pay | Admitting: Family Medicine

## 2018-11-17 NOTE — Telephone Encounter (Signed)
Last office visit: 11/03/2018 Last refilled: 09/05/2018

## 2018-11-24 ENCOUNTER — Other Ambulatory Visit: Payer: Self-pay | Admitting: Family Medicine

## 2018-11-24 ENCOUNTER — Telehealth: Payer: Self-pay | Admitting: Family Medicine

## 2018-11-24 MED ORDER — ZOLPIDEM TARTRATE 10 MG PO TABS: 10.0000 mg | ORAL_TABLET | Freq: Every evening | ORAL | 1 refills | Status: DC | PRN

## 2018-11-24 NOTE — Telephone Encounter (Signed)
Pt called and states that pharm is our of her Xanax (they are out of the .5 mg but I can change to .25 mg) however she would like to know if she could try Ambien instead to help her sleep. Her son takes this and it really helps him. She has been out of the Xanax for a week now and has not gotten a lot of sleep.

## 2018-11-24 NOTE — Telephone Encounter (Signed)
I will call out ambien instead

## 2019-01-13 ENCOUNTER — Other Ambulatory Visit: Payer: Self-pay | Admitting: *Deleted

## 2019-01-13 MED ORDER — ALPRAZOLAM 0.5 MG PO TABS: ORAL_TABLET | ORAL | 3 refills | Status: DC

## 2019-01-13 NOTE — Telephone Encounter (Signed)
Received fax requesting refill on Xanax.  Ok to refill??  Last office visit 11/03/2018.  Last refill 11/17/2018.  Ok to place additional refills on prescription?

## 2019-02-19 ENCOUNTER — Encounter: Payer: Self-pay | Admitting: Gastroenterology

## 2019-02-19 NOTE — Progress Notes (Deleted)
      Primary Care Physician: Susy Frizzle, MD  Primary Gastroenterologist:  Barney Drain, MD   No chief complaint on file.   HPI: Lauren Harrison is a 64 y.o. female here to schedule short interval follow-up colonoscopy due to poor bowel prep.  Procedure was done for constipation and rectal bleeding.  First colonoscopy was canceled because she vomited the Suprep.  Second colonoscopy, July 2020, was attempted with Plenvu.  Found to have a normal ileum, poor bowel prep, 5 polyps removed, measuring 5 to 6 mm.  An 8 mm polyp in the descending colon removed and site tattooed, internal hemorrhoids.  Pathology revealed tubular adenomas.  Recommended surveillance colonoscopy in 6 months to 1 year with a 3-day bowel prep.  Current Outpatient Medications  Medication Sig Dispense Refill  . acetaminophen (TYLENOL) 325 MG tablet Take 325 mg by mouth daily as needed for moderate pain or headache.     . ALPRAZolam (XANAX) 0.5 MG tablet TAKE 1 TABLET(0.5 MG) BY MOUTH AT BEDTIME AS NEEDED 30 tablet 3  . amLODipine (NORVASC) 5 MG tablet TAKE 1 TABLET(5 MG) BY MOUTH DAILY 90 tablet 3  . atorvastatin (LIPITOR) 20 MG tablet TAKE 1 TABLET BY MOUTH DAILY 90 tablet 1  . losartan (COZAAR) 100 MG tablet TAKE 1 TABLET(100 MG) BY MOUTH DAILY 90 tablet 1  . zolpidem (AMBIEN) 10 MG tablet Take 1 tablet (10 mg total) by mouth at bedtime as needed for sleep. 15 tablet 1   No current facility-administered medications for this visit.    Allergies as of 02/20/2019 - Review Complete 08/16/2018  Allergen Reaction Noted  . Sulfa antibiotics Nausea Only 08/02/2018  . Suprep [na sulfate-k sulfate-mg sulf]  08/02/2018    ROS:  General: Negative for anorexia, weight loss, fever, chills, fatigue, weakness. ENT: Negative for hoarseness, difficulty swallowing , nasal congestion. CV: Negative for chest pain, angina, palpitations, dyspnea on exertion, peripheral edema.  Respiratory: Negative for dyspnea at rest, dyspnea  on exertion, cough, sputum, wheezing.  GI: See history of present illness. GU:  Negative for dysuria, hematuria, urinary incontinence, urinary frequency, nocturnal urination.  Endo: Negative for unusual weight change.    Physical Examination:   There were no vitals taken for this visit.  General: Well-nourished, well-developed in no acute distress.  Eyes: No icterus. Mouth: Oropharyngeal mucosa moist and pink , no lesions erythema or exudate. Lungs: Clear to auscultation bilaterally.  Heart: Regular rate and rhythm, no murmurs rubs or gallops.  Abdomen: Bowel sounds are normal, nontender, nondistended, no hepatosplenomegaly or masses, no abdominal bruits or hernia , no rebound or guarding.   Extremities: No lower extremity edema. No clubbing or deformities. Neuro: Alert and oriented x 4   Skin: Warm and dry, no jaundice.   Psych: Alert and cooperative, normal mood and affect.  Labs:  Lab Results  Component Value Date   CREATININE 0.88 11/03/2018   BUN 11 11/03/2018   NA 139 11/03/2018   K 4.9 11/03/2018   CL 103 11/03/2018   CO2 28 11/03/2018   Lab Results  Component Value Date   ALT 33 (H) 11/03/2018   AST 28 11/03/2018   ALKPHOS 106 06/26/2016   BILITOT 0.6 11/03/2018   Lab Results  Component Value Date   WBC 6.2 11/03/2018   HGB 13.1 11/03/2018   HCT 40.0 11/03/2018   MCV 87.9 11/03/2018   PLT 312 11/03/2018     Imaging Studies: No results found.

## 2019-02-20 ENCOUNTER — Ambulatory Visit: Payer: BLUE CROSS/BLUE SHIELD | Admitting: Gastroenterology

## 2019-05-05 ENCOUNTER — Encounter: Payer: BLUE CROSS/BLUE SHIELD | Admitting: Family Medicine

## 2019-05-23 ENCOUNTER — Other Ambulatory Visit: Payer: Self-pay

## 2019-05-23 ENCOUNTER — Encounter: Payer: Self-pay | Admitting: Family Medicine

## 2019-05-23 ENCOUNTER — Ambulatory Visit (INDEPENDENT_AMBULATORY_CARE_PROVIDER_SITE_OTHER): Payer: BLUE CROSS/BLUE SHIELD | Admitting: Family Medicine

## 2019-05-23 VITALS — BP 162/80 | HR 96 | Temp 97.3°F | Resp 16 | Ht 64.0 in | Wt 143.0 lb

## 2019-05-23 DIAGNOSIS — Z0000 Encounter for general adult medical examination without abnormal findings: Secondary | ICD-10-CM

## 2019-05-23 DIAGNOSIS — Z0001 Encounter for general adult medical examination with abnormal findings: Secondary | ICD-10-CM | POA: Diagnosis not present

## 2019-05-23 DIAGNOSIS — E7800 Pure hypercholesterolemia, unspecified: Secondary | ICD-10-CM | POA: Diagnosis not present

## 2019-05-23 DIAGNOSIS — I10 Essential (primary) hypertension: Secondary | ICD-10-CM

## 2019-05-23 DIAGNOSIS — Z1231 Encounter for screening mammogram for malignant neoplasm of breast: Secondary | ICD-10-CM

## 2019-05-23 DIAGNOSIS — L409 Psoriasis, unspecified: Secondary | ICD-10-CM | POA: Insufficient documentation

## 2019-05-23 NOTE — Progress Notes (Signed)
Subjective:    Patient ID: Lauren Harrison, female    DOB: 23-Oct-1955, 64 y.o.   MRN: [id number]   Here for CPE.  I am no longer a preferred provider in her network.  Therefore her insurance possibly will not cover her Pap smear or lab work today.  For instance she recently had a colonoscopy in July of last year.  This did reveal 5 polyps that were tubular adenomas.  She is due for repeat colonoscopy this summer.  However she does over $9000 because she was out of network to the gastroenterologist who performed.  She is overdue for mammogram.  She is also due for a Pap smear.  We had a long discussion today regarding her insurance.  Currently her insurance is through [facility] and therefore it is recommended that she see the [facility] primary care physician.  I am happy to schedule the patient for mammogram, follow-up colonoscopy, and Pap Smear However I Am Not Sure What She Would Have To Pay.  She Is Very Concerned about This Because She Owes Almost $9000 on Colonoscopy from Last Year.  She Also Has Severe Psoriasis on the Dorsums of Both Hands, on Both Elbows, Elbow Dorsums of Both Feet.  She Is Currently Not under Any Medication for This and Would like to See a Dermatologist for.  Her Blood Pressure Is Elevated Today.  However She Is Nervous.  Typically Her Blood Pressure Is in the 120-130 Range over 80-90. Past Medical History:  Diagnosis Date  . Anemia   . Anxiety   . Arthritis   . Full dentures   . HLD (hyperlipidemia)   . Hypertension   . Insomnia   . Psoriasis   . Smoker    Past Surgical History:  Procedure Laterality Date  . APPENDECTOMY    . BREAST LUMPECTOMY WITH RADIOACTIVE SEED LOCALIZATION Left 01/05/2014   Procedure: LEFT BREAST LUMPECTOMY WITH RADIOACTIVE SEED LOCALIZATION;  Surgeon: Fanny Skates, MD;  Location: [place];  Service: General;  Laterality: Left;  . COLONOSCOPY WITH PROPOFOL N/A 08/16/2018   Dr. Oneida Alar: Normal terminal ileum, poor bowel prep, 5 polyps  removed measuring 5 to 6 mm.  8 mm polyp in the descending colon removed and site tattooed.  Internal hemorrhoids.  Pathology revealed tubular adenomas.  Next colonoscopy in 6 to 12 months.  Marland Kitchen POLYPECTOMY  08/16/2018   Procedure: POLYPECTOMY;  Surgeon: Danie Binder, MD;  [facility];  Service: Endoscopy;;  colon  . TUBAL LIGATION     Current Outpatient Medications on File Prior to Visit  Medication Sig Dispense Refill  . acetaminophen (TYLENOL) 325 MG tablet Take 325 mg by mouth daily as needed for moderate pain or headache.     . ALPRAZolam (XANAX) 0.5 MG tablet TAKE 1 TABLET(0.5 MG) BY MOUTH AT BEDTIME AS NEEDED 30 tablet 3  . amLODipine (NORVASC) 5 MG tablet TAKE 1 TABLET(5 MG) BY MOUTH DAILY 90 tablet 3  . atorvastatin (LIPITOR) 20 MG tablet TAKE 1 TABLET BY MOUTH DAILY 90 tablet 1  . losartan (COZAAR) 100 MG tablet TAKE 1 TABLET(100 MG) BY MOUTH DAILY 90 tablet 1  . zolpidem (AMBIEN) 10 MG tablet Take 1 tablet (10 mg total) by mouth at bedtime as needed for sleep. 15 tablet 1   No current facility-administered medications on file prior to visit.   Allergies  Allergen Reactions  . Sulfa Antibiotics Nausea Only  . Suprep [Na Sulfate-K Sulfate-Mg Sulf]     VOMITING  Social History   Socioeconomic History  . Marital status: Married    Spouse name: Not on file  . Number of children: Not on file  . Years of education: Not on file  . Highest education level: Not on file  Occupational History  . Not on file  Tobacco Use  . Smoking status: Current Every Day Smoker    Packs/day: 1.00    Types: Cigarettes  . Smokeless tobacco: Never Used  Substance and Sexual Activity  . Alcohol use: Not Currently    Comment: Occasional  . Drug use: Yes    Types: Marijuana    Comment: "once in a blue moon"  . Sexual activity: Yes    Birth control/protection: None  Other Topics Concern  . Not on file  Social History Narrative  . Not on file   Social Determinants of Health    Financial Resource Strain:   . Difficulty of Paying Living Expenses:   Food Insecurity:   . Worried About Charity fundraiser in the Last Year:   . Arboriculturist in the Last Year:   Transportation Needs:   . Film/video editor (Medical):   Marland Kitchen Lack of Transportation (Non-Medical):   Physical Activity:   . Days of Exercise per Week:   . Minutes of Exercise per Session:   Stress:   . Feeling of Stress :   Social Connections:   . Frequency of Communication with Friends and Family:   . Frequency of Social Gatherings with Friends and Family:   . Attends Religious Services:   . Active Member of Clubs or Organizations:   . Attends Archivist Meetings:   Marland Kitchen Marital Status:   Intimate Partner Violence:   . Fear of Current or Ex-Partner:   . Emotionally Abused:   Marland Kitchen Physically Abused:   . Sexually Abused:      Review of Systems  All other systems reviewed and are negative.      Objective:   Physical Exam Vitals reviewed.  Constitutional:      General: She is not in acute distress.    Appearance: Normal appearance. She is well-developed and normal weight. She is not ill-appearing, toxic-appearing or diaphoretic.  HENT:     Head: Normocephalic and atraumatic.     Right Ear: Tympanic membrane, ear canal and external ear normal. There is no impacted cerumen.     Left Ear: Tympanic membrane, ear canal and external ear normal. There is no impacted cerumen.     Nose: Nose normal. No congestion or rhinorrhea.     Mouth/Throat:     Mouth: Mucous membranes are moist.     Pharynx: No oropharyngeal exudate or posterior oropharyngeal erythema.  Eyes:     General: No scleral icterus.       Right eye: No discharge.        Left eye: No discharge.     Extraocular Movements: Extraocular movements intact.     Conjunctiva/sclera: Conjunctivae normal.     Pupils: Pupils are equal, round, and reactive to light.  Neck:     Vascular: No carotid bruit.  Cardiovascular:     Rate  and Rhythm: Normal rate and regular rhythm.     Pulses: Normal pulses.     Heart sounds: Normal heart sounds. No murmur. No friction rub. No gallop.   Pulmonary:     Effort: Pulmonary effort is normal. No respiratory distress.     Breath sounds: Normal breath sounds. No stridor. No  wheezing or rales.  Chest:     Chest wall: No tenderness.  Abdominal:     General: Bowel sounds are normal. There is no distension.     Palpations: Abdomen is soft. There is no mass.     Tenderness: There is no abdominal tenderness. There is no guarding or rebound.  Musculoskeletal:        General: Normal range of motion.     Cervical back: Normal range of motion. No rigidity.     Right lower leg: No edema.     Left lower leg: No edema.  Lymphadenopathy:     Cervical: No cervical adenopathy.  Skin:    General: Skin is warm.     Coloration: Skin is not jaundiced.     Findings: No bruising, erythema, lesion or rash.  Neurological:     Mental Status: She is alert and oriented to person, place, and time.     Cranial Nerves: No cranial nerve deficit.     Sensory: No sensory deficit.     Motor: No weakness or abnormal muscle tone.     Coordination: Coordination normal.     Gait: Gait normal.     Deep Tendon Reflexes: Reflexes normal.  Psychiatric:        Mood and Affect: Mood normal.        Behavior: Behavior normal.        Thought Content: Thought content normal.        Judgment: Judgment normal.           Assessment & Plan:  Pure hypercholesterolemia - Plan: CBC with Differential/Platelet, COMPLETE METABOLIC PANEL WITH GFR, Lipid panel  Breast cancer screening by mammogram  Benign essential HTN  General medical exam  Psoriasis - Plan: Ambulatory referral to Dermatology  I looked through the patient's preferred provider list and made recommendations on possible PCP physicians for her based on some of my known colleagues.  I will be happy to see the patient however I feel that it is unfair  to her for her to pay out-of-pocket for something that would be covered if she switch to another physician.  Therefore I am happy to see the patient whenever she needs to be seen will be glad to refill her medication until she can establish with a PCP physician in her network.  I have recommended a mammogram as well as a follow-up colonoscopy this year.  However I will defer this to her new primary care physician to help get her established in the [place].  I will schedule the patient to see a dermatologist given her psoriasis.  I have recommended a CBC, CMP, and fasting lipid panel the patient would like to wait and get this ordered at her new physician's office so that it will be covered by her insurance.  I reviewed her most recent lab work from October which was outstanding and therefore I see no rush to repeat it today and have the patient no additional money.  Her blood pressure is elevated today however she believes this is anxiety.  Therefore I have recommended that she check her blood pressure twice a day at home and notify me of the values in 1 week.  If greater than 140/90 we will need to add additional medication.

## 2019-06-22 ENCOUNTER — Other Ambulatory Visit: Payer: Self-pay | Admitting: Family Medicine

## 2019-06-22 NOTE — Telephone Encounter (Signed)
Ok to refill??  Last office visit 05/23/2019  Last refill 01/13/2019

## 2019-06-27 ENCOUNTER — Encounter: Payer: Self-pay | Admitting: Gastroenterology

## 2019-08-16 ENCOUNTER — Other Ambulatory Visit: Payer: Self-pay | Admitting: Family Medicine

## 2019-09-08 ENCOUNTER — Ambulatory Visit (INDEPENDENT_AMBULATORY_CARE_PROVIDER_SITE_OTHER): Payer: BLUE CROSS/BLUE SHIELD | Admitting: Family Medicine

## 2019-09-08 ENCOUNTER — Other Ambulatory Visit: Payer: Self-pay

## 2019-09-08 VITALS — BP 118/80 | HR 78 | Temp 96.5°F | Ht 64.0 in | Wt 145.0 lb

## 2019-09-08 DIAGNOSIS — L409 Psoriasis, unspecified: Secondary | ICD-10-CM

## 2019-09-08 DIAGNOSIS — M069 Rheumatoid arthritis, unspecified: Secondary | ICD-10-CM | POA: Diagnosis not present

## 2019-09-08 NOTE — Progress Notes (Signed)
Subjective:    Patient ID: Lauren Harrison, female    DOB: [birth year], 64 y.o.   MRN: [id number]   Since I last saw the patient, she had to go the emergency room due to pain and swelling in her hands.  Since that time she has seen a rheumatologist who is diagnosed her with rheumatoid arthritis in the hands.  Her dermatologist is treating her for psoriasis.  It seems like there may be a debate as to whether she has psoriatic arthritis versus rheumatoid arthritis however at the present time the rheumatologist is convinced that she has rheumatoid arthritis.  She is currently on prednisone along with methotrexate.  The rheumatologist is checking a CBC a CMP and a urinalysis at every office visit.  She is unable to have labs drawn here because we are out of network.  She states that she simply wanted me updated as to regards of her medical condition as she has not yet found a new primary care per physician.  Blood pressure today is well controlled at 118/80 Past Medical History:  Diagnosis Date  . Anemia   . Anxiety   . Arthritis   . Full dentures   . HLD (hyperlipidemia)   . Hypertension   . Insomnia   . Psoriasis   . Psoriasis   . Smoker    Past Surgical History:  Procedure Laterality Date  . APPENDECTOMY    . BREAST LUMPECTOMY WITH RADIOACTIVE SEED LOCALIZATION Left 01/05/2014   Procedure: LEFT BREAST LUMPECTOMY WITH RADIOACTIVE SEED LOCALIZATION;  Surgeon: Fanny Skates, MD;  Location: [place];  Service: General;  Laterality: Left;  . COLONOSCOPY WITH PROPOFOL N/A 08/16/2018   Dr. Oneida Alar: Normal terminal ileum, poor bowel prep, 5 polyps removed measuring 5 to 6 mm.  8 mm polyp in the descending colon removed and site tattooed.  Internal hemorrhoids.  Pathology revealed tubular adenomas.  Next colonoscopy in 6 to 12 months.  Marland Kitchen POLYPECTOMY  08/16/2018   Procedure: POLYPECTOMY;  Surgeon: Danie Binder, MD;  [facility];  Service: Endoscopy;;  colon  . TUBAL  LIGATION     Current Outpatient Medications on File Prior to Visit  Medication Sig Dispense Refill  . acetaminophen (TYLENOL) 325 MG tablet Take 325 mg by mouth daily as needed for moderate pain or headache.     . ALPRAZolam (XANAX) 0.5 MG tablet TAKE 1 TABLET(0.5 MG) BY MOUTH AT BEDTIME AS NEEDED 30 tablet 3  . amLODipine (NORVASC) 5 MG tablet TAKE 1 TABLET(5 MG) BY MOUTH DAILY 90 tablet 3  . atorvastatin (LIPITOR) 20 MG tablet TAKE 1 TABLET BY MOUTH DAILY 90 tablet 1  . losartan (COZAAR) 100 MG tablet TAKE 1 TABLET(100 MG) BY MOUTH DAILY 90 tablet 1  . zolpidem (AMBIEN) 10 MG tablet Take 1 tablet (10 mg total) by mouth at bedtime as needed for sleep. 15 tablet 1   No current facility-administered medications on file prior to visit.   Allergies  Allergen Reactions  . Sulfa Antibiotics Nausea Only  . Suprep [Na Sulfate-K Sulfate-Mg Sulf]     VOMITING   Social History   Socioeconomic History  . Marital status: Married    Spouse name: Not on file  . Number of children: Not on file  . Years of education: Not on file  . Highest education level: Not on file  Occupational History  . Not on file  Tobacco Use  . Smoking status: Current Every Day Smoker  Packs/day: 1.00    Types: Cigarettes  . Smokeless tobacco: Never Used  Vaping Use  . Vaping Use: Never used  Substance and Sexual Activity  . Alcohol use: Not Currently    Comment: Occasional  . Drug use: Yes    Types: Marijuana    Comment: "once in a blue moon"  . Sexual activity: Yes    Birth control/protection: None  Other Topics Concern  . Not on file  Social History Narrative  . Not on file   Social Determinants of Health   Financial Resource Strain:   . Difficulty of Paying Living Expenses:   Food Insecurity:   . Worried About Charity fundraiser in the Last Year:   . Arboriculturist in the Last Year:   Transportation Needs:   . Film/video editor (Medical):   Marland Kitchen Lack of Transportation (Non-Medical):     Physical Activity:   . Days of Exercise per Week:   . Minutes of Exercise per Session:   Stress:   . Feeling of Stress :   Social Connections:   . Frequency of Communication with Friends and Family:   . Frequency of Social Gatherings with Friends and Family:   . Attends Religious Services:   . Active Member of Clubs or Organizations:   . Attends Archivist Meetings:   Marland Kitchen Marital Status:   Intimate Partner Violence:   . Fear of Current or Ex-Partner:   . Emotionally Abused:   Marland Kitchen Physically Abused:   . Sexually Abused:      Review of Systems  All other systems reviewed and are negative.      Objective:   Physical Exam Vitals reviewed.  Constitutional:      General: She is not in acute distress.    Appearance: Normal appearance. She is well-developed and normal weight. She is not ill-appearing, toxic-appearing or diaphoretic.  HENT:     Head: Normocephalic and atraumatic.  Eyes:     General: No scleral icterus. Neck:     Vascular: No carotid bruit.  Cardiovascular:     Rate and Rhythm: Normal rate and regular rhythm.     Pulses: Normal pulses.     Heart sounds: Normal heart sounds.  Pulmonary:     Effort: Pulmonary effort is normal.     Breath sounds: Normal breath sounds.  Musculoskeletal:     Cervical back: Normal range of motion. No rigidity.  Lymphadenopathy:     Cervical: No cervical adenopathy.  Skin:    General: Skin is warm.     Findings: Rash present.  Neurological:     Mental Status: She is alert and oriented to person, place, and time.     Motor: No abnormal muscle tone.           Assessment & Plan:  Psoriasis  Rheumatoid arthritis involving left hand, unspecified whether rheumatoid factor present ([place])  I explained to the patient that she needs to continue to follow-up with rheumatologist to monitor her liver function test on the methotrexate.  I am glad that the pain in her hands has improved dramatically since starting the  prednisone and the methotrexate.  It appears that they are weaning her down gradually on the prednisone.  The rash on her hands is also improved dramatically.  Patient is due to recheck her cholesterol in October.  Her blood pressure today is well controlled.  I did encourage her to quit smoking

## 2019-11-16 ENCOUNTER — Other Ambulatory Visit: Payer: BLUE CROSS/BLUE SHIELD

## 2019-11-16 ENCOUNTER — Other Ambulatory Visit: Payer: Self-pay

## 2019-11-16 ENCOUNTER — Telehealth: Payer: Self-pay

## 2019-11-16 MED ORDER — LOSARTAN POTASSIUM 100 MG PO TABS: ORAL_TABLET | ORAL | 1 refills | Status: DC

## 2019-11-16 NOTE — Telephone Encounter (Signed)
Pt needs refill on losartan (COZAAR) 100 MG tablet, amLODipine (NORVASC) 5 MG tablet,  atorvastatin (LIPITOR) 20 MG tablet, ALPRAZolam (XANAX) 0.5 MG tablet      Pt call [phone]

## 2019-11-17 LAB — CBC WITH DIFFERENTIAL/PLATELET
Absolute Monocytes: 338 cells/uL (ref 200–950)
Basophils Absolute: 9 cells/uL (ref 0–200)
Basophils Relative: 0.1 %
Eosinophils Absolute: 19 cells/uL (ref 15–500)
Eosinophils Relative: 0.2 %
HCT: 41 % (ref 35.0–45.0)
Hemoglobin: 13.2 g/dL (ref 11.7–15.5)
Lymphs Abs: 959 cells/uL (ref 850–3900)
MCH: 30.1 pg (ref 27.0–33.0)
MCHC: 32.2 g/dL (ref 32.0–36.0)
MCV: 93.4 fL (ref 80.0–100.0)
MPV: 9.7 fL (ref 7.5–12.5)
Monocytes Relative: 3.6 %
Neutro Abs: 8075 cells/uL — ABNORMAL HIGH (ref 1500–7800)
Neutrophils Relative %: 85.9 %
Platelets: 356 10*3/uL (ref 140–400)
RBC: 4.39 10*6/uL (ref 3.80–5.10)
RDW: 15.4 % — ABNORMAL HIGH (ref 11.0–15.0)
Total Lymphocyte: 10.2 %
WBC: 9.4 10*3/uL (ref 3.8–10.8)

## 2019-11-17 LAB — COMPLETE METABOLIC PANEL WITH GFR
AG Ratio: 2.1 (calc) (ref 1.0–2.5)
ALT: 18 U/L (ref 6–29)
AST: 11 U/L (ref 10–35)
Albumin: 4.2 g/dL (ref 3.6–5.1)
Alkaline phosphatase (APISO): 68 U/L (ref 37–153)
BUN: 21 mg/dL (ref 7–25)
CO2: 28 mmol/L (ref 20–32)
Calcium: 9.6 mg/dL (ref 8.6–10.4)
Chloride: 102 mmol/L (ref 98–110)
Creat: 0.84 mg/dL (ref 0.50–0.99)
GFR, Est African American: 86 mL/min/{1.73_m2} (ref >=60)
GFR, Est Non African American: 74 mL/min/{1.73_m2} (ref >=60)
Globulin: 2 g/dL (calc) (ref 1.9–3.7)
Glucose, Bld: 106 mg/dL — ABNORMAL HIGH (ref 65–99)
Potassium: 5.1 mmol/L (ref 3.5–5.3)
Sodium: 135 mmol/L (ref 135–146)
Total Bilirubin: 0.6 mg/dL (ref 0.2–1.2)
Total Protein: 6.2 g/dL (ref 6.1–8.1)

## 2019-11-17 LAB — LIPID PANEL
Cholesterol: 157 mg/dL (ref <200)
HDL: 78 mg/dL (ref >=50)
LDL Cholesterol (Calc): 64 mg/dL (calc)
Non-HDL Cholesterol (Calc): 79 mg/dL (calc) (ref <130)
Total CHOL/HDL Ratio: 2 (calc) (ref <5.0)
Triglycerides: 69 mg/dL (ref <150)

## 2019-11-20 ENCOUNTER — Other Ambulatory Visit: Payer: Self-pay | Admitting: Family Medicine

## 2019-11-20 NOTE — Telephone Encounter (Signed)
Ok to refill??  Last office visit 09/08/2019.  Last refill 06/22/2019, #3 refills.

## 2019-12-15 ENCOUNTER — Ambulatory Visit: Payer: BLUE CROSS/BLUE SHIELD | Admitting: Family Medicine

## 2019-12-15 ENCOUNTER — Other Ambulatory Visit: Payer: Self-pay

## 2019-12-15 VITALS — BP 112/90 | HR 79 | Temp 98.0°F | Ht 64.0 in | Wt 146.0 lb

## 2019-12-15 DIAGNOSIS — R112 Nausea with vomiting, unspecified: Secondary | ICD-10-CM

## 2019-12-15 MED ORDER — ONDANSETRON HCL 4 MG PO TABS: 4.0000 mg | ORAL_TABLET | Freq: Three times a day (TID) | ORAL | 0 refills | Status: DC | PRN

## 2019-12-15 MED ORDER — PANTOPRAZOLE SODIUM 40 MG PO TBEC
40.0000 mg | DELAYED_RELEASE_TABLET | Freq: Every day | ORAL | 3 refills | Status: DC
Start: 2019-12-15 — End: 2019-12-21

## 2019-12-15 NOTE — Progress Notes (Signed)
Subjective:    Patient ID: Lauren Harrison, female    DOB: Feb 28, 1955, 64 y.o.   MRN: [id number]   Patient states that she has felt poorly now for approximately 1 month.  In retrospect, she feels sick usually after she takes methotrexate which she takes once a week for her psoriasis.  Most recently she took methotrexate Monday a week ago.  Shortly thereafter she developed severe nausea and vomiting.  She went to the emergency room at [facility].  I do not have access to the records however I do have discharge paperwork that she brought that indicates that her CBC, CMP, lipase, urinalysis, and abdominal x-rays were all normal.  She was discharged with Zofran and instructed to follow-up with me.  She last took her methotrexate on Wednesday.  She is again very nauseated today.  She denies any melena.  She denies any hematochezia.  She denies any hematemesis.  She is unable to to even tolerate the smell of food.  Smelling food makes her feel nauseated.  She feels weak and tired.  She denies any diarrhea.  She does have occasional constipation.  She denies any bilious emesis.  She denies any right upper quadrant pain.  She is mildly tender to palpation all around the abdomen but there is no specific area that shows any evidence of an acute abdomen such as guarding or rebound.  She has normal bowel sounds. Past Medical History:  Diagnosis Date  . Anemia   . Anxiety   . Arthritis   . Full dentures   . HLD (hyperlipidemia)   . Hypertension   . Insomnia   . Psoriasis   . Psoriasis   . Smoker    Past Surgical History:  Procedure Laterality Date  . APPENDECTOMY    . BREAST LUMPECTOMY WITH RADIOACTIVE SEED LOCALIZATION Left 01/05/2014   Procedure: LEFT BREAST LUMPECTOMY WITH RADIOACTIVE SEED LOCALIZATION;  Surgeon: Fanny Skates, MD;  Location: [place];  Service: General;  Laterality: Left;  . COLONOSCOPY WITH PROPOFOL N/A 08/16/2018   Dr. Oneida Alar: Normal terminal ileum, poor bowel prep, 5  polyps removed measuring 5 to 6 mm.  8 mm polyp in the descending colon removed and site tattooed.  Internal hemorrhoids.  Pathology revealed tubular adenomas.  Next colonoscopy in 6 to 12 months.  Marland Kitchen POLYPECTOMY  08/16/2018   Procedure: POLYPECTOMY;  Surgeon: Danie Binder, MD;  [facility];  Service: Endoscopy;;  colon  . TUBAL LIGATION     Current Outpatient Medications on File Prior to Visit  Medication Sig Dispense Refill  . acetaminophen (TYLENOL) 325 MG tablet Take 325 mg by mouth daily as needed for moderate pain or headache.     . ALPRAZolam (XANAX) 0.5 MG tablet TAKE 1 TABLET(0.5 MG) BY MOUTH AT BEDTIME AS NEEDED 30 tablet 3  . amLODipine (NORVASC) 5 MG tablet TAKE 1 TABLET(5 MG) BY MOUTH DAILY 90 tablet 3  . atorvastatin (LIPITOR) 20 MG tablet TAKE 1 TABLET BY MOUTH DAILY 90 tablet 1  . losartan (COZAAR) 100 MG tablet TAKE 1 TABLET(100 MG) BY MOUTH DAILY 90 tablet 1  . zolpidem (AMBIEN) 10 MG tablet Take 1 tablet (10 mg total) by mouth at bedtime as needed for sleep. 15 tablet 1   No current facility-administered medications on file prior to visit.   Allergies  Allergen Reactions  . Sulfa Antibiotics Nausea Only  . Suprep [Na Sulfate-K Sulfate-Mg Sulf]     VOMITING   Social History  Socioeconomic History  . Marital status: Married    Spouse name: Not on file  . Number of children: Not on file  . Years of education: Not on file  . Highest education level: Not on file  Occupational History  . Not on file  Tobacco Use  . Smoking status: Current Every Day Smoker    Packs/day: 1.00    Types: Cigarettes  . Smokeless tobacco: Never Used  Vaping Use  . Vaping Use: Never used  Substance and Sexual Activity  . Alcohol use: Not Currently    Comment: Occasional  . Drug use: Yes    Types: Marijuana    Comment: "once in a blue moon"  . Sexual activity: Yes    Birth control/protection: None  Other Topics Concern  . Not on file  Social History Narrative  .  Not on file   Social Determinants of Health   Financial Resource Strain:   . Difficulty of Paying Living Expenses: Not on file  Food Insecurity:   . Worried About Charity fundraiser in the Last Year: Not on file  . Ran Out of Food in the Last Year: Not on file  Transportation Needs:   . Lack of Transportation (Medical): Not on file  . Lack of Transportation (Non-Medical): Not on file  Physical Activity:   . Days of Exercise per Week: Not on file  . Minutes of Exercise per Session: Not on file  Stress:   . Feeling of Stress : Not on file  Social Connections:   . Frequency of Communication with Friends and Family: Not on file  . Frequency of Social Gatherings with Friends and Family: Not on file  . Attends Religious Services: Not on file  . Active Member of Clubs or Organizations: Not on file  . Attends Archivist Meetings: Not on file  . Marital Status: Not on file  Intimate Partner Violence:   . Fear of Current or Ex-Partner: Not on file  . Emotionally Abused: Not on file  . Physically Abused: Not on file  . Sexually Abused: Not on file     Review of Systems  All other systems reviewed and are negative.      Objective:   Physical Exam Vitals reviewed.  Constitutional:      General: She is not in acute distress.    Appearance: Normal appearance. She is well-developed and normal weight. She is not ill-appearing, toxic-appearing or diaphoretic.  HENT:     Head: Normocephalic and atraumatic.     Right Ear: Tympanic membrane, ear canal and external ear normal. There is no impacted cerumen.     Left Ear: Tympanic membrane, ear canal and external ear normal. There is no impacted cerumen.     Nose: Nose normal. No congestion or rhinorrhea.     Mouth/Throat:     Mouth: Mucous membranes are dry.     Pharynx: No oropharyngeal exudate or posterior oropharyngeal erythema.  Eyes:     General: No scleral icterus.       Right eye: No discharge.        Left eye: No  discharge.     Extraocular Movements: Extraocular movements intact.     Conjunctiva/sclera: Conjunctivae normal.     Pupils: Pupils are equal, round, and reactive to light.  Neck:     Vascular: No carotid bruit.  Cardiovascular:     Rate and Rhythm: Normal rate and regular rhythm.     Pulses: Normal pulses.  Heart sounds: Normal heart sounds. No murmur heard.  No friction rub. No gallop.   Pulmonary:     Effort: Pulmonary effort is normal. No respiratory distress.     Breath sounds: Normal breath sounds. No stridor. No wheezing or rales.  Chest:     Chest wall: No tenderness.  Abdominal:     General: Bowel sounds are normal. There is no distension.     Palpations: Abdomen is soft. There is no mass.     Tenderness: There is abdominal tenderness. There is no guarding or rebound.  Musculoskeletal:        General: Normal range of motion.     Cervical back: Normal range of motion. No rigidity.     Right lower leg: No edema.     Left lower leg: No edema.  Lymphadenopathy:     Cervical: No cervical adenopathy.  Skin:    General: Skin is warm.     Coloration: Skin is not jaundiced.     Findings: No bruising, erythema, lesion or rash.  Neurological:     Mental Status: She is alert and oriented to person, place, and time.     Cranial Nerves: No cranial nerve deficit.     Sensory: No sensory deficit.     Motor: No weakness or abnormal muscle tone.     Coordination: Coordination normal.     Gait: Gait normal.     Deep Tendon Reflexes: Reflexes normal.  Psychiatric:        Mood and Affect: Mood normal.        Behavior: Behavior normal.        Thought Content: Thought content normal.        Judgment: Judgment normal.           Assessment & Plan:  Nausea and vomiting in adult  Differential diagnosis is large.  Differential diagnosis includes gastritis, peptic ulcer disease, viral gastroenteritis, biliary dyskinesia, chronic constipation, colitis, pancreatic insufficiency,  IBS, etc.  However I suspect that it could be her methotrexate.  Over the last month, she has definitely noticed a relationship between taking methotrexate and her symptoms worsening.  One time she ran out of medication and she states that that is the only time she felt well in the last month.  Therefore we will have her temporarily hold her methotrexate.  She also appears dehydrated today so have asked her to temporarily hold her blood pressure medication, amlodipine and losartan along with her cholesterol pill until she is eating better and better hydrated.  Of asked her to use Zofran 4 mg every 8 hours and also use Protonix 40 mg a day in case this is gastritis or peptic ulcer disease.  Reassess in 1 week.  If no better, will need an EGD.  Gastroparesis would also be on the differential diagnosis.  Seek medical attention immediately if worsening

## 2019-12-21 ENCOUNTER — Telehealth: Payer: Self-pay | Admitting: *Deleted

## 2019-12-21 MED ORDER — PANTOPRAZOLE SODIUM 40 MG PO TBEC: 40.0000 mg | DELAYED_RELEASE_TABLET | Freq: Every day | ORAL | 3 refills | Status: DC

## 2019-12-21 NOTE — Telephone Encounter (Signed)
Received request from pharmacy for PA on Pantoprazole.   PA submitted.   Dx:[id number].  Your information has been submitted to [place]. [employer] [place] will review the request and notify you of the determination decision directly, typically within 72 hours of receiving all information.  You will also receive your request decision electronically. To check for an update later, open this request again from your dashboard.  If [employer] has not responded within the specified timeframe or if you have any questions about your PA submission, contact [facility] directly at [phone].

## 2019-12-21 NOTE — Telephone Encounter (Signed)
Received PA determination.   PA approved 12/21/2019 through 12/19/2020.

## 2020-01-08 ENCOUNTER — Other Ambulatory Visit: Payer: Self-pay

## 2020-01-08 ENCOUNTER — Ambulatory Visit (INDEPENDENT_AMBULATORY_CARE_PROVIDER_SITE_OTHER): Payer: BLUE CROSS/BLUE SHIELD | Admitting: Family Medicine

## 2020-01-08 VITALS — BP 138/78 | HR 96 | Temp 97.8°F | Resp 15 | Ht 64.0 in | Wt 147.0 lb

## 2020-01-08 DIAGNOSIS — R112 Nausea with vomiting, unspecified: Secondary | ICD-10-CM

## 2020-01-08 NOTE — Progress Notes (Signed)
Subjective:    Patient ID: Lauren Harrison, female    DOB: 1955/03/13, 64 y.o.   MRN: [id number]  12/15/19 Patient states that she has felt poorly now for approximately 1 month.  In retrospect, she feels sick usually after she takes methotrexate which she takes once a week for her psoriasis.  Most recently she took methotrexate Monday a week ago.  Shortly thereafter she developed severe nausea and vomiting.  She went to the emergency room at [facility].  I do not have access to the records however I do have discharge paperwork that she brought that indicates that her CBC, CMP, lipase, urinalysis, and abdominal x-rays were all normal.  She was discharged with Zofran and instructed to follow-up with me.  She last took her methotrexate on Wednesday.  She is again very nauseated today.  She denies any melena.  She denies any hematochezia.  She denies any hematemesis.  She is unable to to even tolerate the smell of food.  Smelling food makes her feel nauseated.  She feels weak and tired.  She denies any diarrhea.  She does have occasional constipation.  She denies any bilious emesis.  She denies any right upper quadrant pain.  She is mildly tender to palpation all around the abdomen but there is no specific area that shows any evidence of an acute abdomen such as guarding or rebound.  She has normal bowel sounds. At that time, my plan was: Differential diagnosis is large.  Differential diagnosis includes gastritis, peptic ulcer disease, viral gastroenteritis, biliary dyskinesia, chronic constipation, colitis, pancreatic insufficiency, IBS, etc.  However I suspect that it could be her methotrexate.  Over the last month, she has definitely noticed a relationship between taking methotrexate and her symptoms worsening.  One time she ran out of medication and she states that that is the only time she felt well in the last month.  Therefore we will have her temporarily hold her methotrexate.  She also appears dehydrated today  so have asked her to temporarily hold her blood pressure medication, amlodipine and losartan along with her cholesterol pill until she is eating better and better hydrated.  Of asked her to use Zofran 4 mg every 8 hours and also use Protonix 40 mg a day in case this is gastritis or peptic ulcer disease.  Reassess in 1 week.  If no better, will need an EGD.  Gastroparesis would also be on the differential diagnosis.  Seek medical attention immediately if worsening  01/08/20 Patient states that she feels much better since stopping the methotrexate.  She denies any further nausea or vomiting.  She denies any chills.  She denies any fevers.  She denies any chest pain or abdominal pain.  She has had some constipation but is otherwise doing well.  She has not yet resumed her losartan or her atorvastatin. Past Medical History:  Diagnosis Date  . Anemia   . Anxiety   . Arthritis   . Full dentures   . HLD (hyperlipidemia)   . Hypertension   . Insomnia   . Psoriasis   . Psoriasis   . Smoker    Past Surgical History:  Procedure Laterality Date  . APPENDECTOMY    . BREAST LUMPECTOMY WITH RADIOACTIVE SEED LOCALIZATION Left 01/05/2014   Procedure: LEFT BREAST LUMPECTOMY WITH RADIOACTIVE SEED LOCALIZATION;  Surgeon: Fanny Skates, MD;  Location: [facility];  Service: General;  Laterality: Left;  . COLONOSCOPY WITH PROPOFOL N/A 08/16/2018   Dr. Oneida Alar: Normal  terminal ileum, poor bowel prep, 5 polyps removed measuring 5 to 6 mm.  8 mm polyp in the descending colon removed and site tattooed.  Internal hemorrhoids.  Pathology revealed tubular adenomas.  Next colonoscopy in 6 to 12 months.  Marland Kitchen POLYPECTOMY  08/16/2018   Procedure: POLYPECTOMY;  Surgeon: Danie Binder, MD;  [facility];  Service: Endoscopy;;  colon  . TUBAL LIGATION     Current Outpatient Medications on File Prior to Visit  Medication Sig Dispense Refill  . acetaminophen (TYLENOL) 325 MG tablet Take 325 mg by mouth  daily as needed for moderate pain or headache.  (Patient not taking: Reported on 01/08/2020)    . ALPRAZolam (XANAX) 0.5 MG tablet TAKE 1 TABLET(0.5 MG) BY MOUTH AT BEDTIME AS NEEDED (Patient not taking: Reported on 01/08/2020) 30 tablet 3  . amLODipine (NORVASC) 5 MG tablet TAKE 1 TABLET(5 MG) BY MOUTH DAILY (Patient not taking: Reported on 01/08/2020) 90 tablet 3  . atorvastatin (LIPITOR) 20 MG tablet TAKE 1 TABLET BY MOUTH DAILY (Patient not taking: Reported on 01/08/2020) 90 tablet 1  . losartan (COZAAR) 100 MG tablet TAKE 1 TABLET(100 MG) BY MOUTH DAILY (Patient not taking: Reported on 01/08/2020) 90 tablet 1  . ondansetron (ZOFRAN) 4 MG tablet Take 1 tablet (4 mg total) by mouth every 8 (eight) hours as needed for nausea or vomiting. (Patient not taking: Reported on 01/08/2020) 30 tablet 0  . pantoprazole (PROTONIX) 40 MG tablet Take 1 tablet (40 mg total) by mouth daily. (Patient not taking: Reported on 01/08/2020) 30 tablet 3  . zolpidem (AMBIEN) 10 MG tablet Take 1 tablet (10 mg total) by mouth at bedtime as needed for sleep. 15 tablet 1   No current facility-administered medications on file prior to visit.   Allergies  Allergen Reactions  . Sulfa Antibiotics Nausea Only  . Suprep [Na Sulfate-K Sulfate-Mg Sulf]     VOMITING   Social History   Socioeconomic History  . Marital status: Married    Spouse name: Not on file  . Number of children: Not on file  . Years of education: Not on file  . Highest education level: Not on file  Occupational History  . Not on file  Tobacco Use  . Smoking status: Current Every Day Smoker    Packs/day: 1.00    Types: Cigarettes  . Smokeless tobacco: Never Used  Vaping Use  . Vaping Use: Never used  Substance and Sexual Activity  . Alcohol use: Not Currently    Comment: Occasional  . Drug use: Yes    Types: Marijuana    Comment: "once in a blue moon"  . Sexual activity: Yes    Birth control/protection: None  Other Topics Concern  . Not on file   Social History Narrative  . Not on file   Social Determinants of Health   Financial Resource Strain:   . Difficulty of Paying Living Expenses: Not on file  Food Insecurity:   . Worried About Charity fundraiser in the Last Year: Not on file  . Ran Out of Food in the Last Year: Not on file  Transportation Needs:   . Lack of Transportation (Medical): Not on file  . Lack of Transportation (Non-Medical): Not on file  Physical Activity:   . Days of Exercise per Week: Not on file  . Minutes of Exercise per Session: Not on file  Stress:   . Feeling of Stress : Not on file  Social Connections:   .  Frequency of Communication with Friends and Family: Not on file  . Frequency of Social Gatherings with Friends and Family: Not on file  . Attends Religious Services: Not on file  . Active Member of Clubs or Organizations: Not on file  . Attends Archivist Meetings: Not on file  . Marital Status: Not on file  Intimate Partner Violence:   . Fear of Current or Ex-Partner: Not on file  . Emotionally Abused: Not on file  . Physically Abused: Not on file  . Sexually Abused: Not on file     Review of Systems  All other systems reviewed and are negative.      Objective:   Physical Exam Vitals reviewed.  Constitutional:      General: She is not in acute distress.    Appearance: Normal appearance. She is well-developed and normal weight. She is not ill-appearing, toxic-appearing or diaphoretic.  HENT:     Head: Normocephalic and atraumatic.     Right Ear: Tympanic membrane, ear canal and external ear normal. There is no impacted cerumen.     Left Ear: Tympanic membrane, ear canal and external ear normal. There is no impacted cerumen.     Nose: Nose normal. No congestion or rhinorrhea.     Mouth/Throat:     Mouth: Mucous membranes are dry.     Pharynx: No oropharyngeal exudate or posterior oropharyngeal erythema.  Eyes:     General: No scleral icterus.       Right eye: No  discharge.        Left eye: No discharge.     Extraocular Movements: Extraocular movements intact.     Conjunctiva/sclera: Conjunctivae normal.     Pupils: Pupils are equal, round, and reactive to light.  Neck:     Vascular: No carotid bruit.  Cardiovascular:     Rate and Rhythm: Normal rate and regular rhythm.     Pulses: Normal pulses.     Heart sounds: Normal heart sounds. No murmur heard.  No friction rub. No gallop.   Pulmonary:     Effort: Pulmonary effort is normal. No respiratory distress.     Breath sounds: Normal breath sounds. No stridor. No wheezing or rales.  Chest:     Chest wall: No tenderness.  Abdominal:     General: Bowel sounds are normal. There is no distension.     Palpations: Abdomen is soft. There is no mass.     Tenderness: There is no abdominal tenderness. There is no guarding or rebound.  Musculoskeletal:        General: Normal range of motion.     Cervical back: Normal range of motion. No rigidity.     Right lower leg: No edema.     Left lower leg: No edema.  Lymphadenopathy:     Cervical: No cervical adenopathy.  Skin:    General: Skin is warm.     Coloration: Skin is not jaundiced.     Findings: No bruising, erythema, lesion or rash.  Neurological:     Mental Status: She is alert and oriented to person, place, and time.     Cranial Nerves: No cranial nerve deficit.     Sensory: No sensory deficit.     Motor: No weakness or abnormal muscle tone.     Coordination: Coordination normal.     Gait: Gait normal.     Deep Tendon Reflexes: Reflexes normal.  Psychiatric:        Mood and Affect: Mood normal.  Behavior: Behavior normal.        Thought Content: Thought content normal.        Judgment: Judgment normal.           Assessment & Plan:  Nausea and vomiting in adult  Has resolved off methotrexate.  She has an appointment to see her dermatologist on Monday to discuss alternatives for therapy.  Resume losartan and resume  atorvastatin.  Follow-up for lab work in 3 months.

## 2020-01-29 ENCOUNTER — Other Ambulatory Visit: Payer: Self-pay | Admitting: Family Medicine

## 2020-02-06 ENCOUNTER — Other Ambulatory Visit: Payer: Self-pay

## 2020-02-06 ENCOUNTER — Ambulatory Visit (INDEPENDENT_AMBULATORY_CARE_PROVIDER_SITE_OTHER): Payer: 59 | Admitting: Family Medicine

## 2020-02-06 DIAGNOSIS — Z23 Encounter for immunization: Secondary | ICD-10-CM

## 2020-06-03 ENCOUNTER — Telehealth: Payer: Self-pay

## 2020-06-03 NOTE — Telephone Encounter (Signed)
Transition Care Management Follow-up Telephone Call  Date of discharge and from where: 05/31/2020 from [facility]  How have you been since you were released from the hospital? Pt stated that she is feeling better today.   Any questions or concerns? No   If their condition worsens, is the pt aware to call PCP or go to the Emergency Dept.? Yes  Was the patient provided with contact information for the PCP's office or ED? Yes  Was to pt encouraged to call back with questions or concerns? Yes Functional Questionnaire: (I = Independent and D = Dependent) ADLs: I  Bathing/Dressing- I  Meal Prep- I  Eating- I  Maintaining continence- I  Transferring/Ambulation- I  Managing Meds- I   Follow up appointments reviewed:   PCP Hospital f/u appt confirmed? No    Specialist Hospital f/u appt confirmed? No    Are transportation arrangements needed? No   If their condition worsens, is the pt aware to call PCP or go to the Emergency Dept.? Yes  Was the patient provided with contact information for the PCP's office or ED? Yes  Was to pt encouraged to call back with questions or concerns? Yes

## 2020-07-24 ENCOUNTER — Other Ambulatory Visit: Payer: Self-pay | Admitting: Family Medicine

## 2020-07-24 NOTE — Telephone Encounter (Signed)
Patient has transferred care to your office.  

## 2020-07-25 ENCOUNTER — Other Ambulatory Visit: Payer: Self-pay | Admitting: Family Medicine

## 2021-04-02 ENCOUNTER — Other Ambulatory Visit: Payer: Self-pay | Admitting: Family Medicine

## 2021-04-14 ENCOUNTER — Other Ambulatory Visit: Payer: Self-pay | Admitting: Family Medicine

## 2021-11-27 ENCOUNTER — Telehealth: Payer: Self-pay | Admitting: Gastroenterology

## 2021-11-27 NOTE — Telephone Encounter (Signed)
I am happy to have the opportunity to take over her care. Looks like she is overdue for repeat colonoscopy due to poor preparation. If she is not ready for repeat colonoscopy but wants to be seen in clinic first that is fine to just let me know. Thanks. GM

## 2021-11-27 NOTE — Telephone Encounter (Signed)
Hi Dr. Rush Landmark,    Patient called states she would like to transfer her care over to you. States her daughter recommended you. Patient is currently [facility] patient. Patient had a colonoscopy back in 2020. Records and path are in Epic for review. Please advise on scheduling     Thank you

## 2021-11-28 ENCOUNTER — Encounter: Payer: Self-pay | Admitting: Gastroenterology

## 2021-11-28 NOTE — Telephone Encounter (Signed)
Hi Dr. Rush Landmark,   Patient is schedule for an office visit with you on 1/2.     Thank you

## 2021-11-28 NOTE — Telephone Encounter (Signed)
Sounds like a plan.  Any APP visit and I can supervise. Thanks. GM

## 2021-11-28 NOTE — Telephone Encounter (Signed)
Hello Dr. Rush Landmark,    I called patient to schedule, states she would like to be seen in the office because she is having trouble using the bathroom. However there is no new patient appointment available for you. Can I schedule the office visit with an APP?   Thank you

## 2022-02-03 ENCOUNTER — Other Ambulatory Visit (INDEPENDENT_AMBULATORY_CARE_PROVIDER_SITE_OTHER): Payer: 59

## 2022-02-03 ENCOUNTER — Ambulatory Visit (INDEPENDENT_AMBULATORY_CARE_PROVIDER_SITE_OTHER): Payer: 59 | Admitting: Gastroenterology

## 2022-02-03 ENCOUNTER — Encounter: Payer: Self-pay | Admitting: Gastroenterology

## 2022-02-03 VITALS — BP 130/80 | HR 90 | Ht 64.0 in | Wt 135.0 lb

## 2022-02-03 DIAGNOSIS — K5909 Other constipation: Secondary | ICD-10-CM

## 2022-02-03 DIAGNOSIS — Z8719 Personal history of other diseases of the digestive system: Secondary | ICD-10-CM

## 2022-02-03 DIAGNOSIS — K625 Hemorrhage of anus and rectum: Secondary | ICD-10-CM

## 2022-02-03 DIAGNOSIS — K649 Unspecified hemorrhoids: Secondary | ICD-10-CM

## 2022-02-03 DIAGNOSIS — Z86010 Personal history of colonic polyps: Secondary | ICD-10-CM | POA: Diagnosis not present

## 2022-02-03 DIAGNOSIS — R748 Abnormal levels of other serum enzymes: Secondary | ICD-10-CM

## 2022-02-03 DIAGNOSIS — Z862 Personal history of diseases of the blood and blood-forming organs and certain disorders involving the immune mechanism: Secondary | ICD-10-CM

## 2022-02-03 DIAGNOSIS — R933 Abnormal findings on diagnostic imaging of other parts of digestive tract: Secondary | ICD-10-CM

## 2022-02-03 DIAGNOSIS — Z9049 Acquired absence of other specified parts of digestive tract: Secondary | ICD-10-CM

## 2022-02-03 DIAGNOSIS — R935 Abnormal findings on diagnostic imaging of other abdominal regions, including retroperitoneum: Secondary | ICD-10-CM

## 2022-02-03 DIAGNOSIS — Z860101 Personal history of adenomatous and serrated colon polyps: Secondary | ICD-10-CM

## 2022-02-03 LAB — COMPREHENSIVE METABOLIC PANEL
ALT: 19 U/L (ref 0–35)
AST: 18 U/L (ref 0–37)
Albumin: 4 g/dL (ref 3.5–5.2)
Alkaline Phosphatase: 111 U/L (ref 39–117)
BUN: 8 mg/dL (ref 6–23)
CO2: 28 mEq/L (ref 19–32)
Calcium: 9.9 mg/dL (ref 8.4–10.5)
Chloride: 101 mEq/L (ref 96–112)
Creatinine, Ser: 0.78 mg/dL (ref 0.40–1.20)
GFR: 79.38 mL/min (ref >60.00)
Glucose, Bld: 93 mg/dL (ref 70–99)
Potassium: 4.3 mEq/L (ref 3.5–5.1)
Sodium: 136 mEq/L (ref 135–145)
Total Bilirubin: 0.3 mg/dL (ref 0.2–1.2)
Total Protein: 6.7 g/dL (ref 6.0–8.3)

## 2022-02-03 LAB — IBC + FERRITIN
Ferritin: 9.5 ng/mL — ABNORMAL LOW (ref 10.0–291.0)
Iron: 30 ug/dL — ABNORMAL LOW (ref 42–145)
Saturation Ratios: 8.4 % — ABNORMAL LOW (ref 20.0–50.0)
TIBC: 357 ug/dL (ref 250.0–450.0)
Transferrin: 255 mg/dL (ref 212.0–360.0)

## 2022-02-03 LAB — FOLATE: Folate: 18.3 ng/mL (ref >5.9)

## 2022-02-03 LAB — VITAMIN B12: Vitamin B-12: 323 pg/mL (ref 211–911)

## 2022-02-03 MED ORDER — ONDANSETRON HCL 4 MG PO TABS: 4.0000 mg | ORAL_TABLET | Freq: Three times a day (TID) | ORAL | 3 refills | Status: DC | PRN

## 2022-02-03 MED ORDER — NA SULFATE-K SULFATE-MG SULF 17.5-3.13-1.6 GM/177ML PO SOLN: 1.0000 | ORAL | 0 refills | Status: DC

## 2022-02-03 MED ORDER — ONDANSETRON 8 MG PO TBDP: ORAL_TABLET | ORAL | 0 refills | Status: DC

## 2022-02-03 NOTE — Patient Instructions (Addendum)
Start Miralax - 1 capful dissolved in at least 8 ounces of water daily for 1 week prior to colonoscopy. ( You may also use Miralax as needed now. )   Start Dulcolax 10 mg - Take 1 pill by mouth every other day ( 1 week prior to colonscopy.)   Dissolve Zofran 8 mg under the tongue 1 hour prior to starting preparation for colonoscopy.  -Tablet should be at 5:00 pm ( Monday 03/30/22) -Tablet should be at 7:30 am ( Tuesday 03/31/22)   We have sent the following medications to your pharmacy for you to pick up at your convenience: Suprep and Zofran 4 mg tablet and Zofran 8 mg ,ODT ( for colonoscopy preparation)  Your provider has requested that you go to the basement level for lab work before leaving today. Press "B" on the elevator. The lab is located at the first door on the left as you exit the elevator.  Due to recent changes in healthcare laws, you may see the results of your imaging and laboratory studies on MyChart before your provider has had a chance to review them.  We understand that in some cases there may be results that are confusing or concerning to you. Not all laboratory results come back in the same time frame and the provider may be waiting for multiple results in order to interpret others.  Please give [place] 48 hours in order for your provider to thoroughly review all the results before contacting the office for clarification of your results.   You have been scheduled for an endoscopy and colonoscopy. Please follow the written instructions given to you at your visit today. Please pick up your prep supplies at the pharmacy within the next 1-3 days. If you use inhalers (even only as needed), please bring them with you on the day of your procedure.  Thank you for choosing me and [facility].  Dr. Rush Landmark

## 2022-02-03 NOTE — Progress Notes (Signed)
[place] VISIT   Primary Care Provider Wilburt Finlay, [place] [address] [phone]  Referring Provider Wilburt Finlay, MD [address] [place],  [place] [address] [phone]  Patient Profile: Lauren Harrison is a 67 y.o. female with a pmh significant for hypertension, hyperlipidemia, arthritis, anxiety, diverticulosis, status post partial colectomy for sigmoid stricture/stenosis.   The patient presents to the [facility] for an evaluation and management of problem(s) noted below:  Problem List 1. Hx of adenomatous colonic polyps   2. Status post partial colectomy   3. Chronic constipation   4. History of anemia   5. Elevated alkaline phosphatase level   6. Abnormal CT of the abdomen   7. BRBPR (bright red blood per rectum)   8. Hemorrhoids, unspecified hemorrhoid type   9. Abnormal colonoscopy     History of Present Illness This is the patient's first visit to the outpatient [facility].  The patient states that she has bowel movements on a daily to every other day basis.  Unfortunately she has straining.  When she strains she notices blood.  This has improved somewhat but her bowel movements have not.  She is now going every other day or every 3 days or sometimes even up to a week without a bowel movement.  She has used MiraLAX in the past a few times a week but does not use it on a regular basis.  The patient initially told me that she had a prior history of a small intestine surgery but she actually had a partial sigmoidectomy in the setting of abnormal imaging that was concerning for potential mass but ended up being noncancerous and only stricturing of the sigmoid colon with significant diverticular changes.  That occurred in 2022.  She had a repeat attempt at colonoscopy for follow-up but she had an incomplete preparation and was actually scheduled to potentially undergo a virtual colonoscopy later this year.   She has been referred here for [place] to consider further GI care and updated colonoscopy.  The patient experiences heartburn symptoms periodically.  She states she was diagnosed with an ulcer years ago but never had an upper endoscopy.  Infrequently she will have liquid dysphagia but no solid food dysphagia.  She does not take significant nonsteroidals or BC/Goody powders.  The patient in November was in the emergency department at [facility] for short evaluation in the setting of abdominal pain and was found to have some imaging suggestive of potential gastritis.  GI Review of Systems Positive as above Negative for odynophagia, vomiting, melena  Review of Systems General: Denies fevers/chills/weight loss unintentionally Cardiovascular: Denies chest pain Pulmonary: Denies shortness of breath Gastroenterological: See HPI Genitourinary: Denies darkened urine Hematological: Denies easy bruising/bleeding Endocrine: Denies temperature intolerance Dermatological: Denies jaundice Psychological: Mood is stable   Medications Current Outpatient Medications  Medication Sig Dispense Refill   acetaminophen (TYLENOL) 325 MG tablet Take 325 mg by mouth daily as needed for moderate pain or headache.     ALPRAZolam (XANAX) 0.5 MG tablet TAKE 1 TABLET(0.5 MG) BY MOUTH AT BEDTIME AS NEEDED 30 tablet 3   amLODipine (NORVASC) 5 MG tablet TAKE 1 TABLET(5 MG) BY MOUTH DAILY 90 tablet 3   atorvastatin (LIPITOR) 20 MG tablet TAKE 1 TABLET BY MOUTH DAILY 90 tablet 1   Cholecalciferol (VITAMIN D3) 1.25 MG (50000 UT) CAPS Take 1 capsule by mouth once a week.     diphenhydramine-acetaminophen (TYLENOL PM) 25-500 MG TABS  tablet Take 1 tablet by mouth at bedtime as needed. Triamcinolone 0.5 15 mg     folic acid (FOLVITE) 1 MG tablet Take 1 mg by mouth daily.     losartan (COZAAR) 100 MG tablet TAKE 1 TABLET(100 MG) BY MOUTH DAILY 90 tablet 1   Na Sulfate-K Sulfate-Mg Sulf (SUPREP BOWEL PREP KIT) 17.5-3.13-1.6 GM/177ML SOLN  Take 1 kit by mouth as directed. For colonoscopy prep 354 mL 0   NON FORMULARY Methotrexate injection 50 mg     ondansetron (ZOFRAN) 4 MG tablet Take 1 tablet (4 mg total) by mouth every 8 (eight) hours as needed for nausea or vomiting. 30 tablet 3   ondansetron (ZOFRAN-ODT) 8 MG disintegrating tablet Dissolve 1 tablet under the tongue 1 hour prior to starting Colonoscopy Preparation 2 tablet 0   No current facility-administered medications for this visit.    Allergies Allergies  Allergen Reactions   Sulfa Antibiotics Nausea Only   Suprep [Na Sulfate-K Sulfate-Mg Sulf]     VOMITING    Histories Past Medical History:  Diagnosis Date   Anemia    Anxiety    Arthritis    Full dentures    HLD (hyperlipidemia)    Hypertension    Insomnia    Psoriasis    Psoriasis    Smoker    Past Surgical History:  Procedure Laterality Date   APPENDECTOMY     BREAST LUMPECTOMY WITH RADIOACTIVE SEED LOCALIZATION Left 01/05/2014   Procedure: LEFT BREAST LUMPECTOMY WITH RADIOACTIVE SEED LOCALIZATION;  Surgeon: Fanny Skates, MD;  Location: [place] [facility];  Service: General;  Laterality: Left;   COLONOSCOPY WITH PROPOFOL N/A 08/16/2018   Dr. Oneida Alar: Normal terminal ileum, poor bowel prep, 5 polyps removed measuring 5 to 6 mm.  8 mm polyp in the descending colon removed and site tattooed.  Internal hemorrhoids.  Pathology revealed tubular adenomas.  Next colonoscopy in 6 to 12 months.   POLYPECTOMY  08/16/2018   Procedure: POLYPECTOMY;  Surgeon: Danie Binder, MD;  [facility];  Service: Endoscopy;;  colon   TUBAL LIGATION     Social History   Socioeconomic History   Marital status: Married    Spouse name: Not on file   Number of children: 1   Years of education: Not on file   Highest education level: Not on file  Occupational History   Occupation: retired  Tobacco Use   Smoking status: Every Day    Packs/day: 1.00    Types: Cigarettes   Smokeless tobacco: Never   Vaping Use   Vaping Use: Never used  Substance and Sexual Activity   Alcohol use: Not Currently    Comment: Occasional   Drug use: Yes    Types: Marijuana    Comment: "once in a blue moon"   Sexual activity: Yes    Birth control/protection: None  Other Topics Concern   Not on file  Social History Narrative   Not on file   Social Determinants of Health   Financial Resource Strain: Not on file  Food Insecurity: Not on file  Transportation Needs: Not on file  Physical Activity: Not on file  Stress: Not on file  Social Connections: Not on file  Intimate Partner Violence: Not on file   Family History  Problem Relation Age of Onset   Stroke Mother    Heart disease Mother    Cancer Father    Heart disease Sister    Colon cancer Neg Hx    Esophageal  cancer Neg Hx    Inflammatory bowel disease Neg Hx    Liver disease Neg Hx    Pancreatic cancer Neg Hx    Rectal cancer Neg Hx    Stomach cancer Neg Hx    I have reviewed her medical, social, and family history in detail and updated the electronic medical record as necessary.    PHYSICAL EXAMINATION  BP 130/80   Pulse 90   Ht '5\' 4"'$  (1.626 m)   Wt 135 lb (61.2 kg)   BMI 23.17 kg/m  Wt Readings from Last 3 Encounters:  02/03/22 135 lb (61.2 kg)  01/08/20 147 lb (66.7 kg)  12/15/19 146 lb (66.2 kg)  GEN: NAD, appears stated age, doesn't appear chronically ill PSYCH: Cooperative, without pressured speech EYE: Conjunctivae pink, sclerae anicteric ENT: MMM CV: Nontachycardic RESP: No audible wheezing GI: NABS, soft, protuberant abdomen, surgical scars present, NT, without rebound or guarding GU: DRE offered to patient but she has deferred as she is willing to undergo colonoscopy in the coming weeks MSK/EXT: No significant lower extremity edema SKIN: No jaundice NEURO:  Alert & Oriented x 3, no focal deficits   REVIEW OF DATA  I reviewed the following data at the time of this encounter:  GI Procedures and Studies   2020 Colonoscopy - Preparation of the colon was poor. - The examined portion of the ileum was normal. - Five 5 to 6 mm polyps at the splenic flexure, in the transverse colon(2), at the hepatic flexure and in the cecum, removed with a cold snare. Resected and retrieved. - One 8 mm polyp in the proximal descending colon, removed with a cold biopsy forceps and removed with a cold snare. Resected and retrieved. Tattooed. - RECTAL BLEEDING DUE TO internal hemorrhoids. - Redundant LEFT colon.  Pathology Diagnosis 1. Colon, polyp(s), transverse, cecal, hepatic flexure, splenic flexure - TUBULAR ADENOMA(S). - NO HIGH GRADE DYSPLASIA OR MALIGNANCY. 2. Colon, polyp(s), descending - TUBULAR ADENOMA. - NO HIGH GRADE DYSPLASIA OR MALIGNANCY.  2022 outside colonoscopy [facility] Impression:            - Hemorrhoids found on perianal exam.                         - Stricture in the sigmoid colon.                         - Non-bleeding non-thrombosed internal hemorrhoids.                         - No specimens collected.  Laboratory Studies  Reviewed those in epic and care everywhere  Imaging Studies  November 2023 CT abdomen pelvis with contrast 1. Equivocal pre pyloric gastric wall thickening versus  nondistention, possible gastritis.  2. No other acute findings in the abdomen/pelvis.  3. Unchanged left adrenal adenoma.  4. Small broad-based umbilical hernia containing only fat.  5. A 7 mm well-circumscribed left lower lobe nodule this was present  on prior exam, and described on July 2022 exam. Eighteen months  stability favors a benign etiology. No specific imaging follow-up is  recommended.  August 2023 CT abdomen pelvis with contrast No bowel obstruction or other acute abnormality.  July 2022 CT abdomen pelvis without contrast 1. Fluid-filled nondilated loops of large and small bowel within the  abdomen, suggesting a nonspecific enterocolitis/diarrheal illness.  No evidence of bowel  obstruction.  2.  Redemonstrated thickened segment of sigmoid colon. High-density  material within the sigmoid colon at site of previously seen  hypervascular mass may reflect post biopsy changes and residual high  density barium contrast.  3. 6 mm left lower lobe pulmonary nodule. Non-contrast chest CT at  6-12 months is recommended. If the nodule is stable at time of  repeat CT, then future CT at 18-24 months (from today's scan) is  considered optional for low-risk patients, but is recommended for  high-risk patients. This recommendation follows the consensus  statement: Guidelines for Management of Incidental Pulmonary Nodules  Detected on CT Images: From the Fleischner Society 2017; Radiology  2017; 284:228-243.  4. Left adrenal gland adenoma.  April 2022 CT abdomen pelvis with contrast 1. There is an enhancing focus in the sigmoid colon measuring 1.9 x  1.6 cm. Etiology for this finding uncertain. This finding warrants  direct visualization after appropriate colonic cleansing to assess  for possible potential neoplastic focus in the sigmoid colon.  2. There is sigmoid diverticulosis with relative wall thickening  which may be due to muscular hypertrophy from chronic  diverticulosis. No overt diverticulitis evident.  3. There is a mass arising from the left adrenal measuring 2.4 x 2.1  cm which based on attenuation criteria is considered indeterminate.  This finding warrants a follow-up CT of the adrenals pre and  postcontrast in 1 year to assess for stability. If more aggressive  surveillance of this lesion is felt to be warranted, adrenal MR  could be helpful to further evaluate.  4.  No abscess in the abdomen or pelvis.  No bowel obstruction.  5. No renal or ureteral calculus. No hydronephrosis. Urinary bladder  wall thickness normal.  6.  Hepatic steatosis.  7. Aortic Atherosclerosis (ICD10-I70.0). Extensive iliac artery  atherosclerotic calcification also noted.     ASSESSMENT  Ms. Ent is a 67 y.o. female with a pmh significant for hypertension, hyperlipidemia, arthritis, anxiety, diverticulosis, status post partial colectomy for sigmoid stricture/stenosis.  The patient is seen today for evaluation and management of:  1. Hx of adenomatous colonic polyps   2. Status post partial colectomy   3. Chronic constipation   4. History of anemia   5. Elevated alkaline phosphatase level   6. Abnormal CT of the abdomen   7. BRBPR (bright red blood per rectum)   8. Hemorrhoids, unspecified hemorrhoid type   9. Abnormal colonoscopy    The patient is hemodynamically stable.  Clinically she has multiple GI issues.  She has a history of partial colectomy in the setting of previous sigmoid stricturing but has had chronic constipation issues that have led her to have an incomplete colonoscopy.  We will be aggressive with antiemetics in an effort of trying to do a preparation for her by being more aggressive with her bowel regimen as well prior to her colonoscopy.  She needs an upper endoscopy as well in the setting of her upper GI symptoms and her history of anemia with concern for potential iron deficiency as well as her bright red blood per rectum.  She has a history of elevated alkaline phosphatase in the past and will need to have repeat labs of that as well.  Suspect bleeding is most likely hemorrhoidal in the nature of her having had multiple root previous colonoscopies within the last few years even though they have been incomplete more recently.  Time will tell.  The risks of endoscopic evaluation including bleeding, perforation, infection, aspiration, medication effects were discussed with  the patient and she agrees to this plan of action.   PLAN  Laboratories as outlined below Proceed with scheduling diagnostic endoscopy Proceed with scheduling surveillance colonoscopy She should begin daily MiraLAX try to have bowel movements at least 3-4 times per week and  use Dulcolax as needed if no bowel movement after few days In regards to her preparation for colonoscopy the week prior she needs to be taking MiraLAX once to twice daily and then use Dulcolax 10 mg every other day and then have Zofran available as she takes her Suprep preparation as per other orders (patient instructions given to patient today) Preparation H for hemorrhoidal disease can be utilized at this time Recommend fiber supplementation when she is not getting ready for her colonoscopy Previous imaging had suggested a pulmonary nodule that should be followed up by primary care provider as previously outlined Previous imaging had suggested an adrenal lesion and she can follow-up with her general surgeon, Dr. Ladona Horns if any additional workup or management of that is needed   Orders Placed This Encounter  Procedures   Comp Met (CMET)   IBC + Ferritin   B12   Folate    New Prescriptions   NA SULFATE-K SULFATE-MG SULF (SUPREP BOWEL PREP KIT) 17.5-3.13-1.6 GM/177ML SOLN    Take 1 kit by mouth as directed. For colonoscopy prep   ONDANSETRON (ZOFRAN) 4 MG TABLET    Take 1 tablet (4 mg total) by mouth every 8 (eight) hours as needed for nausea or vomiting.   ONDANSETRON (ZOFRAN-ODT) 8 MG DISINTEGRATING TABLET    Dissolve 1 tablet under the tongue 1 hour prior to starting Colonoscopy Preparation   Modified Medications   No medications on file    Planned Follow Up No follow-ups on file.   Total Time in Face-to-Face and in Coordination of Care for patient including independent/personal interpretation/review of prior testing, medical history, examination, medication adjustment, communicating results with the patient directly, and documentation within the EHR is 45 minutes.   Justice Britain, MD [facility] Office # [phone]

## 2022-02-06 ENCOUNTER — Encounter: Payer: Self-pay | Admitting: Gastroenterology

## 2022-03-31 ENCOUNTER — Encounter: Payer: Self-pay | Admitting: Gastroenterology

## 2022-03-31 ENCOUNTER — Ambulatory Visit (AMBULATORY_SURGERY_CENTER): Payer: 59 | Admitting: Gastroenterology

## 2022-03-31 VITALS — BP 108/54 | HR 79 | Temp 98.2°F | Resp 14 | Ht 64.0 in | Wt 135.0 lb

## 2022-03-31 DIAGNOSIS — D123 Benign neoplasm of transverse colon: Secondary | ICD-10-CM

## 2022-03-31 DIAGNOSIS — K222 Esophageal obstruction: Secondary | ICD-10-CM

## 2022-03-31 DIAGNOSIS — K449 Diaphragmatic hernia without obstruction or gangrene: Secondary | ICD-10-CM

## 2022-03-31 DIAGNOSIS — D120 Benign neoplasm of cecum: Secondary | ICD-10-CM

## 2022-03-31 DIAGNOSIS — D122 Benign neoplasm of ascending colon: Secondary | ICD-10-CM

## 2022-03-31 DIAGNOSIS — D124 Benign neoplasm of descending colon: Secondary | ICD-10-CM

## 2022-03-31 DIAGNOSIS — Z86010 Personal history of colonic polyps: Secondary | ICD-10-CM | POA: Diagnosis not present

## 2022-03-31 DIAGNOSIS — Z4802 Encounter for removal of sutures: Secondary | ICD-10-CM | POA: Diagnosis not present

## 2022-03-31 DIAGNOSIS — K2970 Gastritis, unspecified, without bleeding: Secondary | ICD-10-CM

## 2022-03-31 DIAGNOSIS — Z09 Encounter for follow-up examination after completed treatment for conditions other than malignant neoplasm: Secondary | ICD-10-CM

## 2022-03-31 DIAGNOSIS — Z862 Personal history of diseases of the blood and blood-forming organs and certain disorders involving the immune mechanism: Secondary | ICD-10-CM

## 2022-03-31 DIAGNOSIS — K2950 Unspecified chronic gastritis without bleeding: Secondary | ICD-10-CM

## 2022-03-31 DIAGNOSIS — K229 Disease of esophagus, unspecified: Secondary | ICD-10-CM

## 2022-03-31 MED ORDER — SODIUM CHLORIDE 0.9 % IV SOLN: 500.0000 mL | Freq: Once | INTRAVENOUS | Status: DC

## 2022-03-31 MED ORDER — OMEPRAZOLE 40 MG PO CPDR: 40.0000 mg | DELAYED_RELEASE_CAPSULE | Freq: Two times a day (BID) | ORAL | 9 refills | Status: DC

## 2022-03-31 MED ORDER — HYDROCORTISONE ACETATE 25 MG RE SUPP: 25.0000 mg | Freq: Every day | RECTAL | 1 refills | Status: AC

## 2022-03-31 NOTE — Progress Notes (Signed)
Vss nad trans to pacu °

## 2022-03-31 NOTE — Op Note (Signed)
[facility] Patient Name: Lauren Harrison Procedure Date: 03/31/2022 1:24 PM MRN: [id number] Endoscopist: Justice Britain , MD, [id number] Age: 67 Referring MD:  Date of Birth: 1955/06/12 Gender: Female Account #: [id number] Procedure:                Colonoscopy Indications:              Surveillance: Personal history of adenomatous                            polyps on last colonoscopy > 5 years ago,                            Incomplete preparation on last 2 colonoscopy                            attempts elsewhere Medicines:                Monitored Anesthesia Care Procedure:                Pre-Anesthesia Assessment:                           - Prior to the procedure, a History and Physical                            was performed, and patient medications and                            allergies were reviewed. The patient's tolerance of                            previous anesthesia was also reviewed. The risks                            and benefits of the procedure and the sedation                            options and risks were discussed with the patient.                            All questions were answered, and informed consent                            was obtained. Prior Anticoagulants: The patient has                            taken no anticoagulant or antiplatelet agents. ASA                            Grade Assessment: III - A patient with severe                            systemic disease. After reviewing the risks and  benefits, the patient was deemed in satisfactory                            condition to undergo the procedure.                           After obtaining informed consent, the colonoscope                            was passed under direct vision. Throughout the                            procedure, the patient's blood pressure, pulse, and                            oxygen saturations were monitored continuously. The                             Olympus CF-[id number] ([id number]) Colonoscope was                            introduced through the anus and advanced to the the                            cecum, identified by appendiceal orifice and                            ileocecal valve. The colonoscopy was somewhat                            difficult due to inadequate bowel prep and                            significant looping. Successful completion of the                            procedure was aided by changing the patient's                            position, using manual pressure, straightening and                            shortening the scope to obtain bowel loop                            reduction, using scope torsion and lavage. The                            patient tolerated the procedure. The quality of the                            bowel preparation was fair. The ileocecal valve,  appendiceal orifice, and rectum were photographed. Scope In: 1:54:19 PM Scope Out: 2:14:18 PM Scope Withdrawal Time: 0 hours 16 minutes 4 seconds  Total Procedure Duration: 0 hours 19 minutes 59 seconds  Findings:                 The digital rectal exam findings include                            hemorrhoids. Pertinent negatives include no                            palpable rectal lesions.                           The colon (entire examined portion) revealed                            significantly excessive looping.                           Extensive amounts of semi-liquid stool was found in                            the entire colon, interfering with visualization.                            Lavage of the area was performed using copious                            amounts, resulting in clearance with fair                            visualization.                           Four sessile polyps were found in the descending                            colon, transverse colon, ascending  colon and cecum.                            The polyps were 3 to 12 mm in size. These polyps                            were removed with a cold snare. Resection and                            retrieval were complete.                           Multiple small-mouthed diverticula were found in                            the left colon.  A tattoo was seen in the descending colon (39 cm).                            The tattoo site appeared normal.                           There was evidence of a prior end-to-side                            colo-colonic anastomosis in the recto-sigmoid colon                            (at 20 cm). This was patent and was characterized                            by healthy appearing mucosa and an intact staple                            line. The anastomosis was traversed. 5 of the                            staples were removed with forceps. There also                            appeared to be a diverticulum at the anastomosis                            which had many staples within it and were not                            removed.                           Non-bleeding non-thrombosed external and internal                            hemorrhoids were found during retroflexion, during                            perianal exam and during digital exam. The                            hemorrhoids were Grade III (internal hemorrhoids                            that prolapse but require manual reduction). Complications:            No immediate complications. Estimated Blood Loss:     Estimated blood loss was minimal. Impression:               - Preparation of the colon was fair even after                            copious lavage.                           -  Hemorrhoids found on digital rectal exam.                           - There was significant looping of the colon.                           - Four 3 to 12 mm polyps in the descending  colon,                            in the transverse colon, in the ascending colon and                            in the cecum, removed with a cold snare. Resected                            and retrieved.                           - Diverticulosis in the left colon.                           - A tattoo was seen in the descending colon (39                            cm). The tattoo site appeared normal.                           - Patent end-to-side colo-colonic anastomosis (20                            cm), characterized by healthy appearing mucosa and                            an intact staple line. 5 of the staples were                            removed. A diverticulum with multiple staple still                            remains intact.                           - Non-bleeding non-thrombosed external and internal                            hemorrhoids. Recommendation:           - The patient will be observed post-procedure,                            until all discharge criteria are met.                           - Discharge patient to home.                           -  Patient has a contact number available for                            emergencies. The signs and symptoms of potential                            delayed complications were discussed with the                            patient. Return to normal activities tomorrow.                            Written discharge instructions were provided to the                            patient.                           - High fiber diet.                           - Use FiberCon 1-2 tablets PO daily.                           - Continue present medications.                           - Await pathology results.                           - Repeat colonoscopy in 6-12 months because the                            bowel preparation was inadequate for appropriate                            surveillance/screening. Will need 2-day  preparation.                           - The findings and recommendations were discussed                            with the patient.                           - The findings and recommendations were discussed                            with the designated responsible adult. Justice Britain, MD 03/31/2022 2:34:25 PM

## 2022-03-31 NOTE — Patient Instructions (Signed)
Patient given handouts given on diverticulosis, high fiber diet, polyps, hemorrhoids. Start omeprazole 40 mg twice a day for one month and then once a day. Use FiberCon 1-2 tablets everyday. **Repeat colonoscopy in 6-12 months with a 2 day prep as prep was not adequate today.       YOU HAD AN ENDOSCOPIC PROCEDURE TODAY AT [facility]:   Refer to the procedure report that was given to you for any specific questions about what was found during the examination.  If the procedure report does not answer your questions, please call your gastroenterologist to clarify.  If you requested that your care partner not be given the details of your procedure findings, then the procedure report has been included in a sealed envelope for you to review at your convenience later.  YOU SHOULD EXPECT: Some feelings of bloating in the abdomen. Passage of more gas than usual.  Walking can help get rid of the air that was put into your GI tract during the procedure and reduce the bloating. If you had a lower endoscopy (such as a colonoscopy or flexible sigmoidoscopy) you may notice spotting of blood in your stool or on the toilet paper. If you underwent a bowel prep for your procedure, you may not have a normal bowel movement for a few days.  Please Note:  You might notice some irritation and congestion in your nose or some drainage.  This is from the oxygen used during your procedure.  There is no need for concern and it should clear up in a day or so.  SYMPTOMS TO REPORT IMMEDIATELY:  Following lower endoscopy (colonoscopy or flexible sigmoidoscopy):  Excessive amounts of blood in the stool  Significant tenderness or worsening of abdominal pains  Swelling of the abdomen that is new, acute  Fever of 100F or higher  Following upper endoscopy (EGD)  Vomiting of blood or coffee ground material  New chest pain or pain under the shoulder blades  Painful or persistently difficult swallowing  New  shortness of breath  Fever of 100F or higher  Black, tarry-looking stools  For urgent or emergent issues, a gastroenterologist can be reached at any hour by calling [phone]. Do not use MyChart messaging for urgent concerns.    DIET:  We do recommend a small meal at first, but then you may proceed to your regular diet.  Drink plenty of fluids but you should avoid alcoholic beverages for 24 hours.  ACTIVITY:  You should plan to take it easy for the rest of today and you should NOT DRIVE or use heavy machinery until tomorrow (because of the sedation medicines used during the test).    FOLLOW UP: Our staff will call the number listed on your records the next business day following your procedure.  We will call around 7:15- 8:00 am to check on you and address any questions or concerns that you may have regarding the information given to you following your procedure. If we do not reach you, we will leave a message.     If any biopsies were taken you will be contacted by phone or by letter within the next 1-3 weeks.  Please call us at [phone] if you have not heard about the biopsies in 3 weeks.    SIGNATURES/CONFIDENTIALITY: You and/or your care partner have signed paperwork which will be entered into your electronic medical record.  These signatures attest to the fact that that the information above on your After Visit Summary  has been reviewed and is understood.  Full responsibility of the confidentiality of this discharge information lies with you and/or your care-partner. 

## 2022-03-31 NOTE — Progress Notes (Signed)
GASTROENTEROLOGY PROCEDURE H&P NOTE   Primary Care Physician: Wilburt Finlay, MD  HPI: Lauren Harrison is a 67 y.o. female who presents for EGD/colonoscopy for abdominal pain evaluation in the setting of previous abnormal CT concerning for gastritis and surveillance of personal history of adenomatous colon polyps (with incomplete colonoscopy preps more recently).  Past Medical History:  Diagnosis Date   Anemia    Anxiety    Arthritis    Full dentures    HLD (hyperlipidemia)    Hypertension    Insomnia    Psoriasis    Psoriasis    Smoker    Past Surgical History:  Procedure Laterality Date   APPENDECTOMY     BREAST LUMPECTOMY WITH RADIOACTIVE SEED LOCALIZATION Left 01/05/2014   Procedure: LEFT BREAST LUMPECTOMY WITH RADIOACTIVE SEED LOCALIZATION;  Surgeon: Fanny Skates, MD;  Location: [facility];  Service: General;  Laterality: Left;   COLONOSCOPY WITH PROPOFOL N/A 08/16/2018   Dr. Oneida Alar: Normal terminal ileum, poor bowel prep, 5 polyps removed measuring 5 to 6 mm.  8 mm polyp in the descending colon removed and site tattooed.  Internal hemorrhoids.  Pathology revealed tubular adenomas.  Next colonoscopy in 6 to 12 months.   POLYPECTOMY  08/16/2018   Procedure: POLYPECTOMY;  Surgeon: Danie Binder, MD;  Location: [facility];  Service: Endoscopy;;  colon   TUBAL LIGATION     Current Outpatient Medications  Medication Sig Dispense Refill   acetaminophen (TYLENOL) 325 MG tablet Take 325 mg by mouth daily as needed for moderate pain or headache.     ALPRAZolam (XANAX) 0.5 MG tablet TAKE 1 TABLET(0.5 MG) BY MOUTH AT BEDTIME AS NEEDED 30 tablet 3   amLODipine (NORVASC) 5 MG tablet TAKE 1 TABLET(5 MG) BY MOUTH DAILY 90 tablet 3   atorvastatin (LIPITOR) 20 MG tablet TAKE 1 TABLET BY MOUTH DAILY 90 tablet 1   Cholecalciferol (VITAMIN D3) 1.25 MG (50000 UT) CAPS Take 1 capsule by mouth once a week.     diphenhydramine-acetaminophen (TYLENOL PM) 25-500 MG TABS  tablet Take 1 tablet by mouth at bedtime as needed. Triamcinolone 0.5 15 mg     folic acid (FOLVITE) 1 MG tablet Take 1 mg by mouth daily.     losartan (COZAAR) 100 MG tablet TAKE 1 TABLET(100 MG) BY MOUTH DAILY 90 tablet 1   Na Sulfate-K Sulfate-Mg Sulf (SUPREP BOWEL PREP KIT) 17.5-3.13-1.6 GM/177ML SOLN Take 1 kit by mouth as directed. For colonoscopy prep 354 mL 0   NON FORMULARY Methotrexate injection 50 mg     ondansetron (ZOFRAN) 4 MG tablet Take 1 tablet (4 mg total) by mouth every 8 (eight) hours as needed for nausea or vomiting. 30 tablet 3   ondansetron (ZOFRAN-ODT) 8 MG disintegrating tablet Dissolve 1 tablet under the tongue 1 hour prior to starting Colonoscopy Preparation 2 tablet 0   No current facility-administered medications for this visit.    Current Outpatient Medications:    acetaminophen (TYLENOL) 325 MG tablet, Take 325 mg by mouth daily as needed for moderate pain or headache., Disp: , Rfl:    ALPRAZolam (XANAX) 0.5 MG tablet, TAKE 1 TABLET(0.5 MG) BY MOUTH AT BEDTIME AS NEEDED, Disp: 30 tablet, Rfl: 3   amLODipine (NORVASC) 5 MG tablet, TAKE 1 TABLET(5 MG) BY MOUTH DAILY, Disp: 90 tablet, Rfl: 3   atorvastatin (LIPITOR) 20 MG tablet, TAKE 1 TABLET BY MOUTH DAILY, Disp: 90 tablet, Rfl: 1   Cholecalciferol (VITAMIN D3) 1.25 MG (50000 UT)  CAPS, Take 1 capsule by mouth once a week., Disp: , Rfl:    diphenhydramine-acetaminophen (TYLENOL PM) 25-500 MG TABS tablet, Take 1 tablet by mouth at bedtime as needed. Triamcinolone 0.5 15 mg, Disp: , Rfl:    folic acid (FOLVITE) 1 MG tablet, Take 1 mg by mouth daily., Disp: , Rfl:    losartan (COZAAR) 100 MG tablet, TAKE 1 TABLET(100 MG) BY MOUTH DAILY, Disp: 90 tablet, Rfl: 1   Na Sulfate-K Sulfate-Mg Sulf (SUPREP BOWEL PREP KIT) 17.5-3.13-1.6 GM/177ML SOLN, Take 1 kit by mouth as directed. For colonoscopy prep, Disp: 354 mL, Rfl: 0   NON FORMULARY, Methotrexate injection 50 mg, Disp: , Rfl:    ondansetron (ZOFRAN) 4 MG tablet, Take 1  tablet (4 mg total) by mouth every 8 (eight) hours as needed for nausea or vomiting., Disp: 30 tablet, Rfl: 3   ondansetron (ZOFRAN-ODT) 8 MG disintegrating tablet, Dissolve 1 tablet under the tongue 1 hour prior to starting Colonoscopy Preparation, Disp: 2 tablet, Rfl: 0 Allergies  Allergen Reactions   Sulfa Antibiotics Nausea Only   Suprep [Na Sulfate-K Sulfate-Mg Sulf]     VOMITING   Family History  Problem Relation Age of Onset   Stroke Mother    Heart disease Mother    Cancer Father    Heart disease Sister    Colon cancer Neg Hx    Esophageal cancer Neg Hx    Inflammatory bowel disease Neg Hx    Liver disease Neg Hx    Pancreatic cancer Neg Hx    Rectal cancer Neg Hx    Stomach cancer Neg Hx    Social History   Socioeconomic History   Marital status: Married    Spouse name: Not on file   Number of children: 1   Years of education: Not on file   Highest education level: Not on file  Occupational History   Occupation: retired  Tobacco Use   Smoking status: Every Day    Packs/day: 1.00    Types: Cigarettes   Smokeless tobacco: Never  Vaping Use   Vaping Use: Never used  Substance and Sexual Activity   Alcohol use: Not Currently    Comment: Occasional   Drug use: Yes    Types: Marijuana    Comment: "once in a blue moon"   Sexual activity: Yes    Birth control/protection: None  Other Topics Concern   Not on file  Social History Narrative   Not on file   Social Determinants of Health   Financial Resource Strain: Not on file  Food Insecurity: Not on file  Transportation Needs: Not on file  Physical Activity: Not on file  Stress: Not on file  Social Connections: Not on file  Intimate Partner Violence: Not on file    Physical Exam: There were no vitals filed for this visit. There is no height or weight on file to calculate BMI. GEN: NAD EYE: Sclerae anicteric ENT: MMM CV: Non-tachycardic GI: Soft, NT/ND NEURO:  Alert & Oriented x 3  Lab  Results: No results for input(s): "WBC", "HGB", "HCT", "PLT" in the last 72 hours. BMET No results for input(s): "NA", "K", "CL", "CO2", "GLUCOSE", "BUN", "CREATININE", "CALCIUM" in the last 72 hours. LFT No results for input(s): "PROT", "ALBUMIN", "AST", "ALT", "ALKPHOS", "BILITOT", "BILIDIR", "IBILI" in the last 72 hours. PT/INR No results for input(s): "LABPROT", "INR" in the last 72 hours.   Impression / Plan: This is a 67 y.o.female who presents for EGD/colonoscopy for abdominal pain evaluation  in the setting of previous abnormal CT concerning for gastritis and surveillance of personal history of adenomatous colon polyps (with incomplete colonoscopy preps more recently).  The risks and benefits of endoscopic evaluation/treatment were discussed with the patient and/or family; these include but are not limited to the risk of perforation, infection, bleeding, missed lesions, lack of diagnosis, severe illness requiring hospitalization, as well as anesthesia and sedation related illnesses.  The patient's history has been reviewed, patient examined, no change in status, and deemed stable for procedure.  The patient and/or family is agreeable to proceed.    Justice Britain, MD [facility] Office # [id number]

## 2022-03-31 NOTE — Progress Notes (Signed)
Pt's states no medical or surgical changes since previsit or office visit. 

## 2022-03-31 NOTE — Progress Notes (Signed)
Called to room to assist during endoscopic procedure.  Patient ID and intended procedure confirmed with present staff. Received instructions for my participation in the procedure from the performing physician.  

## 2022-03-31 NOTE — Op Note (Signed)
[facility] Patient Name: Lauren Harrison Procedure Date: 03/31/2022 1:25 PM MRN: [id number] Endoscopist: Justice Britain , MD, [id number] Age: 67 Referring MD:  Date of Birth: 08/14/1955 Gender: Female Account #: [id number] Procedure:                Upper GI endoscopy Indications:              Epigastric abdominal pain, Dysphagia, Abnormal CT                            of the GI tract Medicines:                Monitored Anesthesia Care Procedure:                Pre-Anesthesia Assessment:                           - Prior to the procedure, a History and Physical                            was performed, and patient medications and                            allergies were reviewed. The patient's tolerance of                            previous anesthesia was also reviewed. The risks                            and benefits of the procedure and the sedation                            options and risks were discussed with the patient.                            All questions were answered, and informed consent                            was obtained. Prior Anticoagulants: The patient has                            taken no anticoagulant or antiplatelet agents. ASA                            Grade Assessment: III - A patient with severe                            systemic disease. After reviewing the risks and                            benefits, the patient was deemed in satisfactory                            condition to undergo the procedure.  After obtaining informed consent, the endoscope was                            passed under direct vision. Throughout the                            procedure, the patient's blood pressure, pulse, and                            oxygen saturations were monitored continuously. The                            GIF [id number] was introduced through the                            mouth, and advanced to the second  part of duodenum.                            The upper GI endoscopy was accomplished without                            difficulty. The patient tolerated the procedure. Scope In: Scope Out: Findings:                 No gross lesions were noted in the esophagus.                            Biopsies were taken with a cold forceps for                            histology to rule out EOE/LOE.                           A low-grade of narrowing Schatzki ring was found at                            the gastroesophageal junction. Biopsies were taken                            with a cold forceps for disruption                            circumferentially of the ring.                           The Z-line was irregular and was found 34 cm from                            the incisors.                           After the rest of the EGD was completed, a  guidewire was placed and the scope was withdrawn.                            Dilation was performed in the esophagus with a                            Savary dilator with mild resistance at 16 mm. The                            dilation site was examined following endoscope                            reinsertion and showed mild mucosal disruption just                            below the UES, mild improvement in luminal                            narrowing and no perforation.                           A 4 cm hiatal hernia was present.                           Patchy moderate inflammation characterized by                            erosions, erythema and granularity was found in the                            entire examined stomach. Biopsies were taken with a                            cold forceps for histology and Helicobacter pylori                            testing.                           No gross lesions were noted in the duodenal bulb,                            in the first portion of the duodenum and in the                             second portion of the duodenum. Biopsies were taken                            with a cold forceps for histology. Complications:            No immediate complications. Estimated Blood Loss:     Estimated blood loss was minimal. Impression:               - No gross lesions in the esophagus. Biopsied.                           -  Low-grade of narrowing Schatzki ring. Disrupted.                           - Z-line irregular, 34 cm from the incisors.                           - Dilation performed in the esophagus up to 16 mm.                            Small mucosal wrent noted just below the UES.                           - 4 cm hiatal hernia.                           - Gastritis. Biopsied.                           - No gross lesions in the duodenal bulb, in the                            first portion of the duodenum and in the second                            portion of the duodenum. Biopsied. Recommendation:           - Proceed to scheduled colonoscopy.                           - Start Omeprazole 40 mg twice daily for 1 month                            and then 40 mg once daily thereafter.                           - Observe patient's clinical course.                           - Await pathology results.                           - Depending on how the patient does in the near                            future after dilation could consider repeat                            dilation with savory up to 18 millimeter pending                            how the patient is doing on symptoms.                           - The findings and recommendations were discussed  with the patient.                           - The findings and recommendations were discussed                            with the designated responsible adult. Justice Britain, MD 03/31/2022 2:27:12 PM

## 2022-04-01 ENCOUNTER — Telehealth: Payer: Self-pay

## 2022-04-01 NOTE — Telephone Encounter (Signed)
  Follow up Call-     03/31/2022   12:44 PM  Call back number  Post procedure Call Back phone  # [phone]  Permission to leave phone message Yes     Patient questions:  Do you have a fever, pain , or abdominal swelling? No. Pain Score  0 *  Have you tolerated food without any problems? Yes.    Have you been able to return to your normal activities? Yes.    Do you have any questions about your discharge instructions: Diet   No. Medications  No. Follow up visit  No.  Do you have questions or concerns about your Care? No.  Actions: * If pain score is 4 or above: No action needed, pain <4.

## 2022-04-07 ENCOUNTER — Encounter: Payer: Self-pay | Admitting: Gastroenterology

## 2022-06-02 ENCOUNTER — Encounter: Payer: Self-pay | Admitting: Gastroenterology

## 2022-06-02 ENCOUNTER — Ambulatory Visit (INDEPENDENT_AMBULATORY_CARE_PROVIDER_SITE_OTHER): Payer: 59 | Admitting: Gastroenterology

## 2022-06-02 VITALS — BP 118/68 | HR 78 | Ht 63.0 in | Wt 139.0 lb

## 2022-06-02 DIAGNOSIS — R1310 Dysphagia, unspecified: Secondary | ICD-10-CM

## 2022-06-02 DIAGNOSIS — K222 Esophageal obstruction: Secondary | ICD-10-CM | POA: Diagnosis not present

## 2022-06-02 DIAGNOSIS — K5909 Other constipation: Secondary | ICD-10-CM | POA: Diagnosis not present

## 2022-06-02 DIAGNOSIS — Z86010 Personal history of colonic polyps: Secondary | ICD-10-CM | POA: Diagnosis not present

## 2022-06-02 DIAGNOSIS — Z862 Personal history of diseases of the blood and blood-forming organs and certain disorders involving the immune mechanism: Secondary | ICD-10-CM

## 2022-06-02 DIAGNOSIS — R1031 Right lower quadrant pain: Secondary | ICD-10-CM

## 2022-06-02 DIAGNOSIS — R1319 Other dysphagia: Secondary | ICD-10-CM | POA: Diagnosis not present

## 2022-06-02 DIAGNOSIS — K449 Diaphragmatic hernia without obstruction or gangrene: Secondary | ICD-10-CM

## 2022-06-02 NOTE — Progress Notes (Signed)
GASTROENTEROLOGY OUTPATIENT CLINIC VISIT   Primary Care Provider Beatrix Fetters, MD [address] [place] [address] [phone]  Patient Profile: Lauren Harrison is a 67 y.o. female with a pmh significant for hypertension, hyperlipidemia, arthritis, anxiety, diverticulosis,, hiatal hernia, Schatzki ring, status post partial colectomy for sigmoid stricture/stenosis, colon polyps (TA's).   The patient presents to the [facility] for an evaluation and management of problem(s) noted below:  Problem List 1. Hx of adenomatous colonic polyps   2. Chronic constipation   3. Schatzki's ring   4. Esophageal dysphagia   5. Abdominal discomfort in right lower quadrant   6. Hiatal hernia   7. History of anemia     History of Present Illness Please see prior notes for full details of HPI.  Interval History The patient returns for follow-up.  Patient states that she continues to deal with chronic constipation.  She may use the restroom every 2 to 4 days but may still go up to a week.  She is using MiraLAX more regularly.  This does not help completely.  The FiberCon was not helpful for her.  She is not noticing any blood in her stools.  She is taking her PPI daily to every other day.  She still has some soreness that occurs at times during the day in the right lower quadrant area.  This persists even she has not had a bowel movement.  It is adjacent to her surgical scar.  Her dysphagia symptoms have significantly improved after her recent EGD with dilation.  GI Review of Systems Positive as above Negative for odynophagia, nausea, vomiting, melena, hematochezia  Review of Systems General: Denies fevers/chills/weight loss unintentionally Cardiovascular: Denies chest pain Pulmonary: Denies shortness of breath Gastroenterological: See HPI Genitourinary: Denies darkened urine Hematological: Denies easy bruising/bleeding Endocrine: Denies temperature  intolerance Dermatological: Denies jaundice Psychological: Mood is stable   Medications Current Outpatient Medications  Medication Sig Dispense Refill   acetaminophen (TYLENOL) 325 MG tablet Take 325 mg by mouth daily as needed for moderate pain or headache.     ALPRAZolam (XANAX) 0.5 MG tablet TAKE 1 TABLET(0.5 MG) BY MOUTH AT BEDTIME AS NEEDED 30 tablet 3   amLODipine (NORVASC) 5 MG tablet TAKE 1 TABLET(5 MG) BY MOUTH DAILY 90 tablet 3   atorvastatin (LIPITOR) 20 MG tablet TAKE 1 TABLET BY MOUTH DAILY 90 tablet 1   diphenhydramine-acetaminophen (TYLENOL PM) 25-500 MG TABS tablet Take 1 tablet by mouth at bedtime as needed. Triamcinolone 0.5 15 mg     folic acid (FOLVITE) 1 MG tablet Take 1 mg by mouth daily.     losartan (COZAAR) 100 MG tablet TAKE 1 TABLET(100 MG) BY MOUTH DAILY 90 tablet 1   methotrexate 50 MG/2ML injection SMARTSIG:0.4 Milliliter(s) SUB-Q Once a Week     NON FORMULARY Methotrexate injection 50 mg     omeprazole (PRILOSEC) 40 MG capsule Take 1 capsule (40 mg total) by mouth in the morning and at bedtime. (Patient taking differently: Take 40 mg by mouth daily.) 60 capsule 9   ondansetron (ZOFRAN) 4 MG tablet Take 1 tablet (4 mg total) by mouth every 8 (eight) hours as needed for nausea or vomiting. 30 tablet 3   ondansetron (ZOFRAN-ODT) 8 MG disintegrating tablet Dissolve 1 tablet under the tongue 1 hour prior to starting Colonoscopy Preparation 2 tablet 0   triamcinolone cream (KENALOG) 0.5 % Apply topically.     Cholecalciferol (VITAMIN D3) 1.25 MG (50000 UT) CAPS Take 1 capsule by  mouth once a week. (Patient not taking: Reported on 06/02/2022)     No current facility-administered medications for this visit.    Allergies Allergies  Allergen Reactions   Sulfa Antibiotics Nausea Only   Suprep [Na Sulfate-K Sulfate-Mg Sulf]     VOMITING    Histories Past Medical History:  Diagnosis Date   Anemia    Anxiety    Arthritis    Full dentures    HLD  (hyperlipidemia)    Hypertension    Insomnia    Psoriasis    Psoriasis    Smoker    Past Surgical History:  Procedure Laterality Date   APPENDECTOMY     BREAST LUMPECTOMY WITH RADIOACTIVE SEED LOCALIZATION Left 01/05/2014   Procedure: LEFT BREAST LUMPECTOMY WITH RADIOACTIVE SEED LOCALIZATION;  Surgeon: Claud Kelp, MD;  Location: [facility];  Service: General;  Laterality: Left;   COLONOSCOPY WITH PROPOFOL N/A 08/16/2018   Dr. Darrick Penna: Normal terminal ileum, poor bowel prep, 5 polyps removed measuring 5 to 6 mm.  8 mm polyp in the descending colon removed and site tattooed.  Internal hemorrhoids.  Pathology revealed tubular adenomas.  Next colonoscopy in 6 to 12 months.   POLYPECTOMY  08/16/2018   Procedure: POLYPECTOMY;  Surgeon: West Bali, MD;  [facility];  Service: Endoscopy;;  colon   TUBAL LIGATION     Social History   Socioeconomic History   Marital status: Married    Spouse name: Not on file   Number of children: 1   Years of education: Not on file   Highest education level: Not on file  Occupational History   Occupation: retired  Tobacco Use   Smoking status: Every Day    Packs/day: 1    Types: Cigarettes   Smokeless tobacco: Never  Vaping Use   Vaping Use: Never used  Substance and Sexual Activity   Alcohol use: Not Currently    Comment: Occasional   Drug use: Yes    Types: Marijuana    Comment: "once in a blue moon"   Sexual activity: Yes    Birth control/protection: None  Other Topics Concern   Not on file  Social History Narrative   Not on file   Social Determinants of Health   Financial Resource Strain: Not on file  Food Insecurity: Not on file  Transportation Needs: Not on file  Physical Activity: Not on file  Stress: Not on file  Social Connections: Not on file  Intimate Partner Violence: Not on file   Family History  Problem Relation Age of Onset   Stroke Mother    Heart disease Mother    Cancer Father     Heart disease Sister    Colon cancer Neg Hx    Esophageal cancer Neg Hx    Inflammatory bowel disease Neg Hx    Liver disease Neg Hx    Pancreatic cancer Neg Hx    Rectal cancer Neg Hx    Stomach cancer Neg Hx    I have reviewed her medical, social, and family history in detail and updated the electronic medical record as necessary.    PHYSICAL EXAMINATION  BP 118/68   Pulse 78   Ht 5\' 3"  (1.6 m)   Wt 139 lb (63 kg)   BMI 24.62 kg/m  Wt Readings from Last 3 Encounters:  06/02/22 139 lb (63 kg)  03/31/22 135 lb (61.2 kg)  02/03/22 135 lb (61.2 kg)  GEN: NAD, appears stated age, doesn't appear  chronically ill PSYCH: Cooperative, without pressured speech EYE: Conjunctivae pink, sclerae anicteric ENT: MMM CV: Nontachycardic RESP: No audible wheezing GI: NABS, soft, protuberant abdomen, surgical scars present, NT, without rebound or guarding, no evidence of an intra-abdominal hernia being noted on Valsalva maneuver MSK/EXT: No significant lower extremity edema SKIN: No jaundice NEURO:  Alert & Oriented x 3, no focal deficits   REVIEW OF DATA  I reviewed the following data at the time of this encounter:  GI Procedures and Studies  February 2024 EGD - No gross lesions in the esophagus. Biopsied- Low-grade of narrowing Schatzki ring. Disrupted. - Z-line irregular, 34 cm from the incisors. - Dilation performed in the esophagus up to 16 mm. Small mucosal wrent noted just below the UES. - 4 cm hiatal hernia. - Gastritis. Biopsied. - No gross lesions in the duodenal bulb, in the first portion of the duodenum and in the second portion of the duodenum. Biopsied.  February 2024 colonoscopy - Preparation of the colon was fair even after copious lavage. - Hemorrhoids found on digital rectal exam. - There was significant looping of the colon. - Four 3 to 12 mm polyps in the descending colon, in the transverse colon, in the ascending colon and in the cecum, removed with a cold snare. Resected  and retrieved. - Diverticulosis in the left colon. - A tattoo was seen in the descending colon (39 cm). The tattoo site appeared normal. - Patent end-to-side colo-colonic anastomosis (20 cm), characterized by healthy appearing mucosa and an intact staple line. 5 of the staples were removed. A diverticulum with multiple staple still remains intact. - Non-bleeding non-thrombosed external and internal hemorrhoids.  Pathology Diagnosis 1. Surgical [P], gastric antrum and gastric body - ANTRAL AND OXYNTIC MUCOSA WITH MILD CHRONIC NONSPECIFIC GASTRITIS - IMMUNOHISTOCHEMICAL STAIN FOR HELICOBACTER ORGANISMS IS NEGATIVE 2. Surgical [P], duodenal bulb, 2nd portion of duodenum, and distal duodenum - DUODENAL MUCOSA WITHOUT DIAGNOSTIC ABNORMALITY - NEGATIVE FOR CELIAC CHANGE 3. Surgical [P], Schatzki's ring disruption - SQUAMOUS MUCOSA WITH REACTIVE CHANGES SUGGESTIVE OF REFLUX - DETACHED FRAGMENT OF GASTRIC, COLUMNAR MUCOSA WITH MILD CHRONIC INFLAMMATION - NEGATIVE FOR EOSINOPHILIA - NEGATIVE FOR HELICOBACTER ORGANISMS ON H&E STAIN - NEGATIVE FOR INTESTINAL METAPLASIA OR DYSPLASIA 4. Surgical [P], mid esophagus and distal esophagus - SQUAMOUS MUCOSA WITH REACTIVE CHANGES SUGGESTIVE OF REFLUX - NEGATIVE FOR EOSINOPHILIA 5. Surgical [P], colon, transverse, ascending, and cecum, descending - TUBULAR ADENOMA(S) (MULTIPLE FRAGMENTS) - NEGATIVE FOR HIGH-GRADE DYSPLASIA OR MALIGNANCY  Laboratory Studies  Reviewed those in epic and care everywhere  Imaging Studies  No new imaging studies to review   ASSESSMENT  Ms. Sforza is a 67 y.o. female with a pmh significant for hypertension, hyperlipidemia, arthritis, anxiety, diverticulosis,, hiatal hernia, Schatzki ring, status post partial colectomy for sigmoid stricture/stenosis, colon polyps (TA's).  The patient is seen today for evaluation and management of:  1. Hx of adenomatous colonic polyps   2. Chronic constipation   3. Schatzki's ring   4.  Esophageal dysphagia   5. Abdominal discomfort in right lower quadrant   6. Hiatal hernia   7. History of anemia    The patient is hemodynamically and clinically stable.  She is improved from her prior evaluation in clinic.  Her dysphagia standpoint she is doing much better after her dilation and Schatzki ring disruption.  I do think we have a bit more room to even get her a little bit higher, hopefully up to 18 mm.  She will consider this later this year.  In regards to optimizing her bowel habits to give [place] the best opportunity for not having poor preparation, we are going to asked the patient to continue her MiraLAX as she is doing.  We have given her samples for both Linzess 145 mcg as well as Trulance 3 mg.  We will see how she does with each of these (she is mindful not to take both medications at the same time) to see if this will be helpful for the patient.  As well we have gone over some toileting instructions/techniques that may be helpful.  Hopefully she will see a benefit with this and we can get her bowels moving more frequently and see if that helps with some of the right lower quadrant discomfort that comes and goes.  Will consider Levsin or Bentyl use in future though we will have to be mindful in regards to the constipating nature of things.  Will see her back in follow-up in a few months and then at that point plan to schedule her for repeat colonoscopy and possible repeat upper endoscopy time will tell.  With the hope that her bowels are moving more regularly or she will need a 2-day preparation to go for things.  She will be more mindful to take her PPI on a daily basis.  All patient questions were answered to the best of my ability, and the patient agrees to the aforementioned plan of action with follow-up as indicated.   PLAN  Continue MiraLAX daily use Dulcolax if no bowel movement after 2 to 3 days Initiate Linzess sample and if not effective then Trulance samples - If samples are  helpful then we will send in for prescription and patient will let us know Continue fiber supplementation Plan for follow-up in a few months to see if she is doing well at which point we can then schedule her next colonoscopy due to history of polyps and incomplete preparation - 2-day preparation versus her bowel regimen and 1 day preparation Plan for follow-up in a few months to see if she wants to repeat EGD with dilation if dysphagia symptoms have returned or just to get her up to 18 mm Continue PPI once daily   No orders of the defined types were placed in this encounter.   New Prescriptions   No medications on file   Modified Medications   No medications on file    Planned Follow Up No follow-ups on file.   Total Time in Face-to-Face and in Coordination of Care for patient including independent/personal interpretation/review of prior testing, medical history, examination, medication adjustment, communicating results with the patient directly, and documentation within the EHR is 30 minutes.   Corliss Parish, MD [facility] Office # [phone]

## 2022-06-02 NOTE — Patient Instructions (Addendum)
Toileting tips to help with your constipation - Drink at least 64-80 ounces of water/liquid per day. - Establish a time to try to move your bowels every day.  For many people, this is after a cup of coffee or after a meal such as breakfast. - Sit all of the way back on the toilet keeping your back fairly straight and while sitting up, try to rest the tops of your forearms on your upper thighs.   - Raising your feet with a step stool/squatty potty can be helpful to improve the angle that allows your stool to pass through the rectum. - Relax the rectum feeling it bulge toward the toilet water.  If you feel your rectum raising toward your body, you are contracting rather than relaxing. - Breathe in and slowly exhale. "Belly breath" by expanding your belly towards your belly button. Keep belly expanded as you gently direct pressure down and back to the anus.  A low pitched GRRR sound can assist with increasing intra-abdominal pressure.  - Repeat 3-4 times. If unsuccessful, contract the pelvic floor to restore normal tone and get off the toilet.  Avoid excessive straining. - To reduce excessive wiping by teaching your anus to normally contract, place hands on outer aspect of knees and resist knee movement outward.  Hold 5-10 second then place hands just inside of knees and resist inward movement of knees.  Hold 5 seconds.  Repeat a few times each way.  We have given you samples of the following medication to take: Linzess 145 mg - Take 1 pill by mouth once daily. IF the Linzess does not work, then you may try  Trulance 3 mg- Take 1 pill by mouth once daily. ( DO NOT TAKE BOTH PILLS AT THE SAME TIME).   Take Omeprazole- 1 capsule by mouth once daily.   Follow-up on: 09/01/22 at 9:30 am with Doug Sou PA-C   _______________________________________________________  If your blood pressure at your visit was 140/90 or greater, please contact your primary care physician to follow up on  this.  _______________________________________________________  If you are age 66 or older, your body mass index should be between 23-30. Your Body mass index is 24.62 kg/m. If this is out of the aforementioned range listed, please consider follow up with your Primary Care Provider.  If you are age 66 or younger, your body mass index should be between 19-25. Your Body mass index is 24.62 kg/m. If this is out of the aformentioned range listed, please consider follow up with your Primary Care Provider.   ________________________________________________________  The [facility] providers would like to encourage you to use [facility] to communicate with providers for non-urgent requests or questions.  Due to long hold times on the telephone, sending your provider a message by [facility] may be a faster and more efficient way to get a response.  Please allow 48 business hours for a response.  Please remember that this is for non-urgent requests.  _______________________________________________________  Thank you for choosing me and [facility].  Dr. Meridee Score

## 2022-06-06 ENCOUNTER — Encounter: Payer: Self-pay | Admitting: Gastroenterology

## 2022-06-06 DIAGNOSIS — R1031 Right lower quadrant pain: Secondary | ICD-10-CM | POA: Insufficient documentation

## 2022-06-06 DIAGNOSIS — R1319 Other dysphagia: Secondary | ICD-10-CM | POA: Insufficient documentation

## 2022-06-06 DIAGNOSIS — Z862 Personal history of diseases of the blood and blood-forming organs and certain disorders involving the immune mechanism: Secondary | ICD-10-CM | POA: Insufficient documentation

## 2022-06-06 DIAGNOSIS — K449 Diaphragmatic hernia without obstruction or gangrene: Secondary | ICD-10-CM | POA: Insufficient documentation

## 2022-06-06 DIAGNOSIS — K5909 Other constipation: Secondary | ICD-10-CM | POA: Insufficient documentation

## 2022-06-06 DIAGNOSIS — Z86010 Personal history of colonic polyps: Secondary | ICD-10-CM | POA: Insufficient documentation

## 2022-06-06 DIAGNOSIS — K222 Esophageal obstruction: Secondary | ICD-10-CM | POA: Insufficient documentation

## 2022-08-31 NOTE — Progress Notes (Unsigned)
Chief Complaint: Primary GI MD: Dr. Meridee Score  HPI: 67 year old female history of anxiety, hyperlipidemia, hiatal hernia, Schatzki ring, s/p partial colectomy for sigmoid stricture/stenosis, tubular adenomas, presents for follow-up of chronic constipation.  Last seen 06/02/2022 by Dr. Meridee Score  At that time her dysphagia had improved s/p EGD with dilation.  She was still dealing with chronic constipation that did not improve with using MiraLAX more regularly and FiberCon was not helpful for her.  She was recommended to continue MiraLAX daily with Dulcolax.  And initiated Linzess samples. Recommended to follow up in a few months to schedule EGD/Colonoscopy    PREVIOUS GI WORKUP   February 2024 EGD - No gross lesions in the esophagus. Biopsied- Low-grade of narrowing Schatzki ring. Disrupted. - Z-line irregular, 34 cm from the incisors. - Dilation performed in the esophagus up to 16 mm. Small mucosal wrent noted just below the UES. - 4 cm hiatal hernia. - Gastritis. Biopsied. - No gross lesions in the duodenal bulb, in the first portion of the duodenum and in the second portion of the duodenum. Biopsied.   February 2024 colonoscopy - Preparation of the colon was fair even after copious lavage. - Hemorrhoids found on digital rectal exam. - There was significant looping of the colon. - Four 3 to 12 mm polyps in the descending colon, in the transverse colon, in the ascending colon and in the cecum, removed with a cold snare. Resected and retrieved. - Diverticulosis in the left colon. - A tattoo was seen in the descending colon (39 cm). The tattoo site appeared normal. - Patent end-to-side colo-colonic anastomosis (20 cm), characterized by healthy appearing mucosa and an intact staple line. 5 of the staples were removed. A diverticulum with multiple staple still remains intact. - Non-bleeding non-thrombosed external and internal hemorrhoids.   Pathology Diagnosis 1. Surgical [P], gastric  antrum and gastric body - ANTRAL AND OXYNTIC MUCOSA WITH MILD CHRONIC NONSPECIFIC GASTRITIS - IMMUNOHISTOCHEMICAL STAIN FOR HELICOBACTER ORGANISMS IS NEGATIVE 2. Surgical [P], duodenal bulb, 2nd portion of duodenum, and distal duodenum - DUODENAL MUCOSA WITHOUT DIAGNOSTIC ABNORMALITY - NEGATIVE FOR CELIAC CHANGE 3. Surgical [P], Schatzki's ring disruption - SQUAMOUS MUCOSA WITH REACTIVE CHANGES SUGGESTIVE OF REFLUX - DETACHED FRAGMENT OF GASTRIC, COLUMNAR MUCOSA WITH MILD CHRONIC INFLAMMATION - NEGATIVE FOR EOSINOPHILIA - NEGATIVE FOR HELICOBACTER ORGANISMS ON H&E STAIN - NEGATIVE FOR INTESTINAL METAPLASIA OR DYSPLASIA 4. Surgical [P], mid esophagus and distal esophagus - SQUAMOUS MUCOSA WITH REACTIVE CHANGES SUGGESTIVE OF REFLUX - NEGATIVE FOR EOSINOPHILIA 5. Surgical [P], colon, transverse, ascending, and cecum, descending - TUBULAR ADENOMA(S) (MULTIPLE FRAGMENTS) - NEGATIVE FOR HIGH-GRADE DYSPLASIA OR MALIGNANCY  Past Medical History:  Diagnosis Date   Anemia    Anxiety    Arthritis    Full dentures    HLD (hyperlipidemia)    Hypertension    Insomnia    Psoriasis    Psoriasis    Smoker     Past Surgical History:  Procedure Laterality Date   APPENDECTOMY     BREAST LUMPECTOMY WITH RADIOACTIVE SEED LOCALIZATION Left 01/05/2014   Procedure: LEFT BREAST LUMPECTOMY WITH RADIOACTIVE SEED LOCALIZATION;  Surgeon: Claud Kelp, MD;  Location: [facility];  Service: General;  Laterality: Left;   COLONOSCOPY WITH PROPOFOL N/A 08/16/2018   Dr. Darrick Penna: Normal terminal ileum, poor bowel prep, 5 polyps removed measuring 5 to 6 mm.  8 mm polyp in the descending colon removed and site tattooed.  Internal hemorrhoids.  Pathology revealed tubular adenomas.  Next colonoscopy in 6  to 12 months.   POLYPECTOMY  08/16/2018   Procedure: POLYPECTOMY;  Surgeon: West Bali, MD;  [facility]: [facility];  Service: Endoscopy;;  colon   TUBAL LIGATION      Current Outpatient  Medications  Medication Sig Dispense Refill   acetaminophen (TYLENOL) 325 MG tablet Take 325 mg by mouth daily as needed for moderate pain or headache.     ALPRAZolam (XANAX) 0.5 MG tablet TAKE 1 TABLET(0.5 MG) BY MOUTH AT BEDTIME AS NEEDED 30 tablet 3   amLODipine (NORVASC) 5 MG tablet TAKE 1 TABLET(5 MG) BY MOUTH DAILY 90 tablet 3   atorvastatin (LIPITOR) 20 MG tablet TAKE 1 TABLET BY MOUTH DAILY 90 tablet 1   Cholecalciferol (VITAMIN D3) 1.25 MG (50000 UT) CAPS Take 1 capsule by mouth once a week. (Patient not taking: Reported on 06/02/2022)     diphenhydramine-acetaminophen (TYLENOL PM) 25-500 MG TABS tablet Take 1 tablet by mouth at bedtime as needed. Triamcinolone 0.5 15 mg     folic acid (FOLVITE) 1 MG tablet Take 1 mg by mouth daily.     losartan (COZAAR) 100 MG tablet TAKE 1 TABLET(100 MG) BY MOUTH DAILY 90 tablet 1   methotrexate 50 MG/2ML injection SMARTSIG:0.4 Milliliter(s) SUB-Q Once a Week     NON FORMULARY Methotrexate injection 50 mg     omeprazole (PRILOSEC) 40 MG capsule Take 1 capsule (40 mg total) by mouth in the morning and at bedtime. (Patient taking differently: Take 40 mg by mouth daily.) 60 capsule 9   ondansetron (ZOFRAN) 4 MG tablet Take 1 tablet (4 mg total) by mouth every 8 (eight) hours as needed for nausea or vomiting. 30 tablet 3   ondansetron (ZOFRAN-ODT) 8 MG disintegrating tablet Dissolve 1 tablet under the tongue 1 hour prior to starting Colonoscopy Preparation 2 tablet 0   triamcinolone cream (KENALOG) 0.5 % Apply topically.     No current facility-administered medications for this visit.    Allergies as of 09/01/2022 - Review Complete 06/06/2022  Allergen Reaction Noted   Sulfa antibiotics Nausea Only 08/02/2018   Suprep [na sulfate-k sulfate-mg sulf]  08/02/2018    Family History  Problem Relation Age of Onset   Stroke Mother    Heart disease Mother    Cancer Father    Heart disease Sister    Colon cancer Neg Hx    Esophageal cancer Neg Hx     Inflammatory bowel disease Neg Hx    Liver disease Neg Hx    Pancreatic cancer Neg Hx    Rectal cancer Neg Hx    Stomach cancer Neg Hx     Social History   Socioeconomic History   Marital status: Married    Spouse name: Not on file   Number of children: 1   Years of education: Not on file   Highest education level: Not on file  Occupational History   Occupation: retired  Tobacco Use   Smoking status: Every Day    Current packs/day: 1.00    Types: Cigarettes   Smokeless tobacco: Never  Vaping Use   Vaping status: Never Used  Substance and Sexual Activity   Alcohol use: Not Currently    Comment: Occasional   Drug use: Yes    Types: Marijuana    Comment: "once in a blue moon"   Sexual activity: Yes    Birth control/protection: None  Other Topics Concern   Not on file  Social History Narrative   Not on file   Social  Determinants of Health   Financial Resource Strain: Low Risk  (05/12/2022)   Received from [facility], [facility]   Overall Financial Resource Strain (CARDIA)    Difficulty of Paying Living Expenses: Not hard at all  Food Insecurity: No Food Insecurity (05/12/2022)   Received from [facility], [facility]   Hunger Vital Sign    Worried About Running Out of Food in the Last Year: Never true    Ran Out of Food in the Last Year: Never true  Transportation Needs: No Transportation Needs (05/12/2022)   Received from [facility], [facility]   [facility] - Transportation    Lack of Transportation (Medical): No    Lack of Transportation (Non-Medical): No  Physical Activity: Inactive (05/12/2022)   Received from [facility], [facility]   Exercise Vital Sign    Days of Exercise per Week: 0 days    Minutes of Exercise per Session: 0 min  Stress: No Stress Concern Present (05/12/2022)   Received from [facility], [facility] - Occupational Stress Questionnaire    Feeling of Stress : Not  at all  Social Connections: Moderately Isolated (05/12/2022)   Received from [facility], [facility]   Social Connection and Isolation Panel [NHANES]    Frequency of Communication with Friends and Family: Once a week    Frequency of Social Gatherings with Friends and Family: Once a week    Attends Religious Services: 1 to 4 times per year    Active Member of [employer] or Organizations: No    Attends Banker Meetings: Never    Marital Status: Married  Catering manager Violence: Not At Risk (11/10/2021)   Received from [facility], [facility]   Humiliation, Afraid, Rape, and Kick questionnaire    Fear of Current or Ex-Partner: No    Emotionally Abused: No    Physically Abused: No    Sexually Abused: No    Review of Systems:    Constitutional: No weight loss, fever, chills, weakness or fatigue HEENT: Eyes: No change in vision               Ears, Nose, Throat:  No change in hearing or congestion Skin: No rash or itching Cardiovascular: No chest pain, chest pressure or palpitations   Respiratory: No SOB or cough Gastrointestinal: See HPI and otherwise negative Genitourinary: No dysuria or change in urinary frequency Neurological: No headache, dizziness or syncope Musculoskeletal: No new muscle or joint pain Hematologic: No bleeding or bruising Psychiatric: No history of depression or anxiety    Physical Exam:  Vital signs: There were no vitals taken for this visit.  Constitutional: NAD, Well developed, Well nourished, alert and cooperative Head:  Normocephalic and atraumatic. Eyes:   PEERL, EOMI. No icterus. Conjunctiva pink. Respiratory: Respirations even and unlabored. Lungs clear to auscultation bilaterally.   No wheezes, crackles, or rhonchi.  Cardiovascular:  Regular rate and rhythm. No peripheral edema, cyanosis or pallor.  Gastrointestinal:  Soft, nondistended, nontender. No rebound or guarding. Normal bowel sounds. No appreciable masses or  hepatomegaly. Rectal:  Not performed.  Msk:  Symmetrical without gross deformities. Without edema, no deformity or joint abnormality.  Neurologic:  Alert and  oriented x4;  grossly normal neurologically.  Skin:   Dry and intact without significant lesions or rashes. Psychiatric: Oriented to person, place and time. Demonstrates good judgement and reason without  abnormal affect or behaviors.   RELEVANT LABS AND IMAGING: CBC    Component Value Date/Time   WBC 9.4 11/16/2019 0942   RBC 4.39 11/16/2019 0942   HGB 13.2 11/16/2019 0942   HCT 41.0 11/16/2019 0942   PLT 356 11/16/2019 0942   MCV 93.4 11/16/2019 0942   MCH 30.1 11/16/2019 0942   MCHC 32.2 11/16/2019 0942   RDW 15.4 (H) 11/16/2019 0942   LYMPHSABS 959 11/16/2019 0942   MONOABS 0.6 10/17/2016 2000   EOSABS 19 11/16/2019 0942   BASOSABS 9 11/16/2019 0942    CMP     Component Value Date/Time   NA 136 02/03/2022 1059   K 4.3 02/03/2022 1059   CL 101 02/03/2022 1059   CO2 28 02/03/2022 1059   GLUCOSE 93 02/03/2022 1059   BUN 8 02/03/2022 1059   CREATININE 0.78 02/03/2022 1059   CREATININE 0.84 11/16/2019 0942   CALCIUM 9.9 02/03/2022 1059   PROT 6.7 02/03/2022 1059   ALBUMIN 4.0 02/03/2022 1059   AST 18 02/03/2022 1059   ALT 19 02/03/2022 1059   ALKPHOS 111 02/03/2022 1059   BILITOT 0.3 02/03/2022 1059   GFRNONAA 74 11/16/2019 0942   GFRAA 86 11/16/2019 8295     Assessment/Plan:       Lauren Harrison [facility] 08/31/2022, 3:35 PM  Cc: Beatrix Fetters, MD

## 2022-09-01 ENCOUNTER — Ambulatory Visit (INDEPENDENT_AMBULATORY_CARE_PROVIDER_SITE_OTHER): Payer: 59 | Admitting: Gastroenterology

## 2022-09-01 ENCOUNTER — Ambulatory Visit: Payer: 59 | Admitting: Gastroenterology

## 2022-09-01 ENCOUNTER — Other Ambulatory Visit (INDEPENDENT_AMBULATORY_CARE_PROVIDER_SITE_OTHER): Payer: 59

## 2022-09-01 ENCOUNTER — Encounter: Payer: Self-pay | Admitting: Gastroenterology

## 2022-09-01 VITALS — BP 110/70 | HR 85 | Ht 63.0 in | Wt 138.0 lb

## 2022-09-01 DIAGNOSIS — K5909 Other constipation: Secondary | ICD-10-CM

## 2022-09-01 DIAGNOSIS — R1319 Other dysphagia: Secondary | ICD-10-CM | POA: Diagnosis not present

## 2022-09-01 DIAGNOSIS — K625 Hemorrhage of anus and rectum: Secondary | ICD-10-CM | POA: Diagnosis not present

## 2022-09-01 DIAGNOSIS — K649 Unspecified hemorrhoids: Secondary | ICD-10-CM

## 2022-09-01 DIAGNOSIS — D692 Other nonthrombocytopenic purpura: Secondary | ICD-10-CM

## 2022-09-01 LAB — CBC WITH DIFFERENTIAL/PLATELET
Basophils Absolute: 0.1 10*3/uL (ref 0.0–0.1)
Basophils Relative: 0.7 % (ref 0.0–3.0)
Eosinophils Absolute: 0.1 10*3/uL (ref 0.0–0.7)
Eosinophils Relative: 1.7 % (ref 0.0–5.0)
HCT: 33 % — ABNORMAL LOW (ref 36.0–46.0)
Hemoglobin: 10.8 g/dL — ABNORMAL LOW (ref 12.0–15.0)
Lymphocytes Relative: 22.8 % (ref 12.0–46.0)
Lymphs Abs: 1.7 10*3/uL (ref 0.7–4.0)
MCHC: 32.7 g/dL (ref 30.0–36.0)
MCV: 88.6 fl (ref 78.0–100.0)
Monocytes Absolute: 0.4 10*3/uL (ref 0.1–1.0)
Monocytes Relative: 5.7 % (ref 3.0–12.0)
Neutro Abs: 5.2 10*3/uL (ref 1.4–7.7)
Neutrophils Relative %: 69.1 % (ref 43.0–77.0)
Platelets: 337 10*3/uL (ref 150.0–400.0)
RBC: 3.72 Mil/uL — ABNORMAL LOW (ref 3.87–5.11)
RDW: 17.3 % — ABNORMAL HIGH (ref 11.5–15.5)
WBC: 7.5 10*3/uL (ref 4.0–10.5)

## 2022-09-01 LAB — COMPREHENSIVE METABOLIC PANEL
ALT: 11 U/L (ref 0–35)
AST: 15 U/L (ref 0–37)
Albumin: 4.2 g/dL (ref 3.5–5.2)
Alkaline Phosphatase: 126 U/L — ABNORMAL HIGH (ref 39–117)
BUN: 7 mg/dL (ref 6–23)
CO2: 27 mEq/L (ref 19–32)
Calcium: 9.8 mg/dL (ref 8.4–10.5)
Chloride: 102 mEq/L (ref 96–112)
Creatinine, Ser: 0.77 mg/dL (ref 0.40–1.20)
GFR: 80.29 mL/min (ref >60.00)
Glucose, Bld: 104 mg/dL — ABNORMAL HIGH (ref 70–99)
Potassium: 4 mEq/L (ref 3.5–5.1)
Sodium: 137 mEq/L (ref 135–145)
Total Bilirubin: 0.6 mg/dL (ref 0.2–1.2)
Total Protein: 6.8 g/dL (ref 6.0–8.3)

## 2022-09-01 LAB — IBC + FERRITIN
Ferritin: 7.7 ng/mL — ABNORMAL LOW (ref 10.0–291.0)
Iron: 35 ug/dL — ABNORMAL LOW (ref 42–145)
Saturation Ratios: 10.1 % — ABNORMAL LOW (ref 20.0–50.0)
TIBC: 347.2 ug/dL (ref 250.0–450.0)
Transferrin: 248 mg/dL (ref 212.0–360.0)

## 2022-09-01 LAB — PROTIME-INR
INR: 1.1 ratio — ABNORMAL HIGH (ref 0.8–1.0)
Prothrombin Time: 12.1 s (ref 9.6–13.1)

## 2022-09-01 MED ORDER — LINACLOTIDE 290 MCG PO CAPS: 290.0000 ug | ORAL_CAPSULE | Freq: Every day | ORAL | 3 refills | Status: AC

## 2022-09-01 MED ORDER — HYDROCORTISONE ACETATE 25 MG RE SUPP: 25.0000 mg | Freq: Two times a day (BID) | RECTAL | 1 refills | Status: AC

## 2022-09-01 NOTE — Patient Instructions (Signed)
We have sent the following medications to your pharmacy for you to pick up at your convenience: Linzess , Anucort Suppositories  Your provider has requested that you go to the basement level for lab work before leaving today. Press "B" on the elevator. The lab is located at the first door on the left as you exit the elevator.   You have been scheduled for an endoscopy and colonoscopy. Please follow the written instructions given to you at your visit today.  Please pick up your prep supplies at the pharmacy within the next 1-3 days. (We have given you a Plenvu sample kit in the office today)  If you use inhalers (even only as needed), please bring them with you on the day of your procedure.  DO NOT TAKE 7 DAYS PRIOR TO TEST- Trulicity (dulaglutide) Ozempic, Wegovy (semaglutide) Mounjaro (tirzepatide) Bydureon Bcise (exanatide extended release)  DO NOT TAKE 1 DAY PRIOR TO YOUR TEST Rybelsus (semaglutide) Adlyxin (lixisenatide) Victoza (liraglutide) Byetta (exanatide) ___________________________________________________________________________   Bonita Quin will follow up pending results of procedures  Due to recent changes in healthcare laws, you may see the results of your imaging and laboratory studies on MyChart before your provider has had a chance to review them.  We understand that in some cases there may be results that are confusing or concerning to you. Not all laboratory results come back in the same time frame and the provider may be waiting for multiple results in order to interpret others.  Please give [place] 48 hours in order for your provider to thoroughly review all the results before contacting the office for clarification of your results.    _______________________________________________________  If your blood pressure at your visit was 140/90 or greater, please contact your primary care physician to follow up on  this.  _______________________________________________________  If you are age 55 or older, your body mass index should be between 23-30. Your Body mass index is 24.45 kg/m. If this is out of the aforementioned range listed, please consider follow up with your Primary Care Provider.  If you are age 37 or younger, your body mass index should be between 19-25. Your Body mass index is 24.45 kg/m. If this is out of the aformentioned range listed, please consider follow up with your Primary Care Provider.   ________________________________________________________  The [facility] providers would like to encourage you to use [facility] to communicate with providers for non-urgent requests or questions.  Due to long hold times on the telephone, sending your provider a message by [facility] may be a faster and more efficient way to get a response.  Please allow 48 business hours for a response.  Please remember that this is for non-urgent requests.  _______________________________________________________   I appreciate the  opportunity to care for you  Thank You   [facility]

## 2022-09-01 NOTE — Progress Notes (Signed)
Attending Physician's Attestation   I have reviewed the chart.   I agree with the Advanced Practitioner's note, impression, and recommendations with any updates as below.    Gabriel Mansouraty, MD [facility] Advanced Endoscopy Office # [phone]  

## 2022-10-20 ENCOUNTER — Encounter: Payer: Self-pay | Admitting: Gastroenterology

## 2022-10-26 ENCOUNTER — Telehealth: Payer: Self-pay | Admitting: Gastroenterology

## 2022-10-26 ENCOUNTER — Other Ambulatory Visit: Payer: Self-pay

## 2022-10-26 MED ORDER — ONDANSETRON HCL 4 MG PO TABS: 4.0000 mg | ORAL_TABLET | Freq: Three times a day (TID) | ORAL | 0 refills | Status: AC | PRN

## 2022-10-26 NOTE — Telephone Encounter (Signed)
Inbound call from patient, states she gets nauseas whenever taking her prep medication, would like Zofran called in to take prep. Sent to [facility] in [place].

## 2022-11-03 ENCOUNTER — Ambulatory Visit: Payer: 59 | Admitting: Gastroenterology

## 2022-11-03 ENCOUNTER — Other Ambulatory Visit: Payer: Self-pay | Admitting: Gastroenterology

## 2022-11-03 ENCOUNTER — Encounter: Payer: Self-pay | Admitting: Gastroenterology

## 2022-11-03 VITALS — BP 112/56 | HR 65 | Temp 97.3°F | Resp 15 | Ht 63.0 in | Wt 138.0 lb

## 2022-11-03 DIAGNOSIS — Z1211 Encounter for screening for malignant neoplasm of colon: Secondary | ICD-10-CM | POA: Diagnosis not present

## 2022-11-03 DIAGNOSIS — Z860100 Personal history of colon polyps, unspecified: Secondary | ICD-10-CM | POA: Diagnosis not present

## 2022-11-03 DIAGNOSIS — K2990 Gastroduodenitis, unspecified, without bleeding: Secondary | ICD-10-CM

## 2022-11-03 DIAGNOSIS — K5909 Other constipation: Secondary | ICD-10-CM

## 2022-11-03 DIAGNOSIS — K625 Hemorrhage of anus and rectum: Secondary | ICD-10-CM

## 2022-11-03 DIAGNOSIS — R1319 Other dysphagia: Secondary | ICD-10-CM | POA: Diagnosis not present

## 2022-11-03 DIAGNOSIS — Z09 Encounter for follow-up examination after completed treatment for conditions other than malignant neoplasm: Secondary | ICD-10-CM

## 2022-11-03 DIAGNOSIS — K319 Disease of stomach and duodenum, unspecified: Secondary | ICD-10-CM | POA: Diagnosis not present

## 2022-11-03 MED ORDER — SODIUM CHLORIDE 0.9 % IV SOLN: 500.0000 mL | Freq: Once | INTRAVENOUS | Status: DC

## 2022-11-03 MED ORDER — SUCRALFATE 1 GM/10ML PO SUSP: 1.0000 g | Freq: Two times a day (BID) | ORAL | 1 refills | Status: DC

## 2022-11-03 MED ORDER — PANTOPRAZOLE SODIUM 40 MG PO TBEC: 40.0000 mg | DELAYED_RELEASE_TABLET | Freq: Every day | ORAL | 6 refills | Status: DC

## 2022-11-03 NOTE — Op Note (Signed)
[facility] Patient Name: Lauren Harrison Procedure Date: 11/03/2022 1:42 PM MRN: [id number] Endoscopist: Corliss Parish , MD, [id number] Age: 67 Referring MD:  Date of Birth: [birth year] Gender: Female Account #: [id number] Procedure:                Upper GI endoscopy Indications:              Dysphagia Medicines:                Monitored Anesthesia Care Procedure:                Pre-Anesthesia Assessment:                           - Prior to the procedure, a History and Physical                            was performed, and patient medications and                            allergies were reviewed. The patient's tolerance of                            previous anesthesia was also reviewed. The risks                            and benefits of the procedure and the sedation                            options and risks were discussed with the patient.                            All questions were answered, and informed consent                            was obtained. Prior Anticoagulants: The patient has                            taken no anticoagulant or antiplatelet agents. ASA                            Grade Assessment: II - A patient with mild systemic                            disease. After reviewing the risks and benefits,                            the patient was deemed in satisfactory condition to                            undergo the procedure.                           After obtaining informed consent, the endoscope was  passed under direct vision. Throughout the                            procedure, the patient's blood pressure, pulse, and                            oxygen saturations were monitored continuously. The                            Olympus scope [id number] was introduced through the                            mouth, and advanced to the second part of duodenum.                            The upper GI endoscopy was  accomplished without                            difficulty. The patient tolerated the procedure. Scope In: Scope Out: Findings:                 No gross lesions were noted in the entire                            esophagus. After the rest of the EGD was completed,                            a guidewire was placed and the scope was withdrawn.                            Dilation was performed with a Savary dilator with                            mild resistance at 18 mm. The dilation site was                            examined following endoscope reinsertion and showed                            just below the UES a moderate mucosal disruption,                            moderate improvement in luminal narrowing and no                            perforation.                           The Z-line was regular and was found 40 cm from the                            incisors.  Patchy mild inflammation characterized by erosions                            and erythema was found in the entire examined                            stomach. Biopsies were taken with a cold forceps                            for histology and Helicobacter pylori testing.                           No gross lesions were noted in the duodenal bulb,                            in the first portion of the duodenum and in the                            second portion of the duodenum. Complications:            No immediate complications. Estimated Blood Loss:     Estimated blood loss was minimal. Impression:               - No gross lesions in the entire esophagus. Dilated                            to 18 mm savory with mucosal wrent below the UES.                           - Z-line regular, 40 cm from the incisors.                           - Gastritis. Biopsied.                           - No gross lesions in the duodenal bulb, in the                            first portion of the duodenum and in  the second                            portion of the duodenum. Recommendation:           - Proceed to scheduled colonoscopy.                           - Dilation diet as per protocol.                           - Please use Cepacol or Halls Lozenges +/-                            Chloraseptic spray for next 72-96 hours to aid in  sore thoat should you experience this.                           - Restart PPI once daily                           - Continue present medications.                           - Await pathology results.                           - Repeat upper endoscopy PRN for retreatment.                           - The findings and recommendations were discussed                            with the patient.                           - The findings and recommendations were discussed                            with the patient's family. Corliss Parish, MD 11/03/2022 2:13:26 PM

## 2022-11-03 NOTE — Patient Instructions (Addendum)
Handouts Provided:  Post Dilation Diet and Gastritis  RESTART PPI once daily  START taking carafate 1 g twice daily for 1 month then stop.   Use FiberCon 1-2 Tablets by mouth daily.  REPEAT Colonoscopy in 3 years for surveillance.  YOU HAD AN ENDOSCOPIC PROCEDURE TODAY AT THE [facility]:   Refer to the procedure report that was given to you for any specific questions about what was found during the examination.  If the procedure report does not answer your questions, please call your gastroenterologist to clarify.  If you requested that your care partner not be given the details of your procedure findings, then the procedure report has been included in a sealed envelope for you to review at your convenience later.  YOU SHOULD EXPECT: Some feelings of bloating in the abdomen. Passage of more gas than usual.  Walking can help get rid of the air that was put into your GI tract during the procedure and reduce the bloating. If you had a lower endoscopy (such as a colonoscopy or flexible sigmoidoscopy) you may notice spotting of blood in your stool or on the toilet paper. If you underwent a bowel prep for your procedure, you may not have a normal bowel movement for a few days.  Please Note:  You might notice some irritation and congestion in your nose or some drainage.  This is from the oxygen used during your procedure.  There is no need for concern and it should clear up in a day or so.  SYMPTOMS TO REPORT IMMEDIATELY:  Following lower endoscopy (colonoscopy or flexible sigmoidoscopy):  Excessive amounts of blood in the stool  Significant tenderness or worsening of abdominal pains  Swelling of the abdomen that is new, acute  Fever of 100F or higher  Following upper endoscopy (EGD)  Vomiting of blood or coffee ground material  New chest pain or pain under the shoulder blades  Painful or persistently difficult swallowing  New shortness of breath  Fever of 100F or  higher  Black, tarry-looking stools  For urgent or emergent issues, a gastroenterologist can be reached at any hour by calling [phone]. Do not use MyChart messaging for urgent concerns.    DIET:  See Post Dilation Diet provided.  Drink plenty of fluids but you should avoid alcoholic beverages for 24 hours.  ACTIVITY:  You should plan to take it easy for the rest of today and you should NOT DRIVE or use heavy machinery until tomorrow (because of the sedation medicines used during the test).    FOLLOW UP: Our staff will call the number listed on your records the next business day following your procedure.  We will call around 7:15- 8:00 am to check on you and address any questions or concerns that you may have regarding the information given to you following your procedure. If we do not reach you, we will leave a message.     If any biopsies were taken you will be contacted by phone or by letter within the next 1-3 weeks.  Please call us at [phone] if you have not heard about the biopsies in 3 weeks.    SIGNATURES/CONFIDENTIALITY: You and/or your care partner have signed paperwork which will be entered into your electronic medical record.  These signatures attest to the fact that that the information above on your After Visit Summary has been reviewed and is understood.  Full responsibility of the confidentiality of this discharge information lies with you and/or your  care-partner.

## 2022-11-03 NOTE — Progress Notes (Unsigned)
Pt's states no medical or surgical changes since previsit or office visit. 

## 2022-11-03 NOTE — Progress Notes (Unsigned)
Called to room to assist during endoscopic procedure.  Patient ID and intended procedure confirmed with present staff. Received instructions for my participation in the procedure from the performing physician.  

## 2022-11-03 NOTE — Progress Notes (Unsigned)
Jaw thrust needed during EGD. Uneventful anesthetic. Report to pacu rn. Vss. Care resumed by rn.

## 2022-11-03 NOTE — Op Note (Signed)
[facility] Patient Name: Lauren Harrison Procedure Date: 11/03/2022 1:41 PM MRN: [id number] Endoscopist: Corliss Parish , MD, [id number] Age: 67 Referring MD:  Date of Birth: 01-22-1956 Gender: Female Account #: [id number] Procedure:                Colonoscopy Indications:              High risk colon cancer surveillance: Personal                            history of colonic polyps, Previous poor                            preparation/inadequate preparation, Incidental                            rectal bleeding Medicines:                Monitored Anesthesia Care Procedure:                Pre-Anesthesia Assessment:                           - Prior to the procedure, a History and Physical                            was performed, and patient medications and                            allergies were reviewed. The patient's tolerance of                            previous anesthesia was also reviewed. The risks                            and benefits of the procedure and the sedation                            options and risks were discussed with the patient.                            All questions were answered, and informed consent                            was obtained. Prior Anticoagulants: The patient has                            taken no anticoagulant or antiplatelet agents. ASA                            Grade Assessment: II - A patient with mild systemic                            disease. After reviewing the risks and benefits,  the patient was deemed in satisfactory condition to                            undergo the procedure.                           After obtaining informed consent, the colonoscope                            was passed under direct vision. Throughout the                            procedure, the patient's blood pressure, pulse, and                            oxygen saturations were monitored continuously. The                             Olympus CF-HQ190L ([id number]) Colonoscope was                            introduced through the anus and advanced to the the                            cecum, identified by appendiceal orifice and                            ileocecal valve. The colonoscopy was performed                            without difficulty. The patient tolerated the                            procedure. The quality of the bowel preparation was                            adequate. The ileocecal valve, appendiceal orifice,                            and rectum were photographed. Scope In: 1:56:01 PM Scope Out: 2:07:32 PM Scope Withdrawal Time: 0 hours 6 minutes 43 seconds  Total Procedure Duration: 0 hours 11 minutes 31 seconds  Findings:                 The digital rectal exam findings include                            hemorrhoids. Pertinent negatives include no                            palpable rectal lesions.                           Multiple small-mouthed diverticula were found in  the recto-sigmoid colon and sigmoid colon.                           A tattoo was seen in the sigmoid colon. The tattoo                            site appeared normal.                           There was evidence of a prior end-to-end                            colo-colonic anastomosis in the recto-sigmoid                            colon. This was patent and was characterized by                            healthy appearing mucosa. There appeared to be a                            diverticulum at this area with many staples within                            (these were not removed). The anastomosis was                            traversed.                           Normal mucosa was found in the entire colon                            otherwise.                           Non-bleeding non-thrombosed external and internal                            hemorrhoids were found during  retroflexion, during                            perianal exam and during digital exam. The                            hemorrhoids were Grade III (internal hemorrhoids                            that prolapse but require manual reduction). Complications:            No immediate complications. Estimated Blood Loss:     Estimated blood loss was minimal. Estimated blood                            loss: none. Impression:               -  Hemorrhoids found on digital rectal exam.                           - Diverticulosis in the recto-sigmoid colon and in                            the sigmoid colon.                           - A tattoo was seen in the sigmoid colon. The                            tattoo site appeared normal.                           - Patent end-to-end colo-colonic anastomosis,                            characterized by healthy appearing mucosa and a                            small diverticulum at this area with multiple                            staples (these remain intact).                           - Normal mucosa in the entire examined colon                            otherwise.                           - Non-bleeding non-thrombosed external and internal                            hemorrhoids. Recommendation:           - The patient will be observed post-procedure,                            until all discharge criteria are met.                           - Discharge patient to home.                           - Patient has a contact number available for                            emergencies. The signs and symptoms of potential                            delayed complications were discussed with the                            patient.  Return to normal activities tomorrow.                            Written discharge instructions were provided to the                            patient.                           - High fiber diet.                           - Use  FiberCon 1-2 tablets PO daily.                           - Continue present medications.                           - Repeat colonoscopy in 3 years for surveillance                            due to number of precancerous polyps that were                            removed earlier this year.                           - If patient's hemorrhoidal bleeding persists, we                            will consider referral to one of my partners to                            discuss hemorrhoidal banding protocol.                           - The findings and recommendations were discussed                            with the patient.                           - The findings and recommendations were discussed                            with the patient's family. Corliss Parish, MD 11/03/2022 2:20:52 PM

## 2022-11-03 NOTE — Progress Notes (Unsigned)
GASTROENTEROLOGY PROCEDURE H&P NOTE   Primary Care Physician: Beatrix Fetters, MD  HPI: Lauren Harrison is a 67 y.o. female who presents for EGD/Colonoscopy for Dysphagia and followup dilation with prior history of adenomas.  Past Medical History:  Diagnosis Date   Anemia    Anxiety    Arthritis    Full dentures    HLD (hyperlipidemia)    Hypertension    Insomnia    Psoriasis    Psoriasis    Skin cancer of face    ? basal cell   Smoker    Past Surgical History:  Procedure Laterality Date   APPENDECTOMY     BREAST LUMPECTOMY WITH RADIOACTIVE SEED LOCALIZATION Left 01/05/2014   Procedure: LEFT BREAST LUMPECTOMY WITH RADIOACTIVE SEED LOCALIZATION;  Surgeon: Claud Kelp, MD;  Location: [facility];  Service: General;  Laterality: Left;   COLONOSCOPY WITH PROPOFOL N/A 08/16/2018   Dr. Darrick Penna: Normal terminal ileum, poor bowel prep, 5 polyps removed measuring 5 to 6 mm.  8 mm polyp in the descending colon removed and site tattooed.  Internal hemorrhoids.  Pathology revealed tubular adenomas.  Next colonoscopy in 6 to 12 months.   POLYPECTOMY  08/16/2018   Procedure: POLYPECTOMY;  Surgeon: West Bali, MD;  Location: [facility];  Service: Endoscopy;;  colon   SKIN BIOPSY     x 2 -face and chest   TUBAL LIGATION     Current Outpatient Medications  Medication Sig Dispense Refill   acetaminophen (TYLENOL) 325 MG tablet Take 325 mg by mouth daily as needed for moderate pain or headache.     ALPRAZolam (XANAX) 0.5 MG tablet TAKE 1 TABLET(0.5 MG) BY MOUTH AT BEDTIME AS NEEDED 30 tablet 3   atorvastatin (LIPITOR) 20 MG tablet TAKE 1 TABLET BY MOUTH DAILY 90 tablet 1   folic acid (FOLVITE) 1 MG tablet Take 1 mg by mouth daily.     hydrocortisone (ANUSOL-HC) 25 MG suppository Place 1 suppository (25 mg total) rectally 2 (two) times daily. For 14 days 28 suppository 1   linaclotide (LINZESS) 290 MCG CAPS capsule Take 1 capsule (290 mcg total) by mouth daily  before breakfast. 30 capsule 3   losartan (COZAAR) 100 MG tablet TAKE 1 TABLET(100 MG) BY MOUTH DAILY 90 tablet 1   methotrexate 50 MG/2ML injection SMARTSIG:0.4 Milliliter(s) SUB-Q Once a Week     omeprazole (PRILOSEC) 40 MG capsule Take 1 capsule (40 mg total) by mouth in the morning and at bedtime. (Patient taking differently: Take 40 mg by mouth daily as needed.) 60 capsule 9   ondansetron (ZOFRAN) 4 MG tablet Take 1 tablet (4 mg total) by mouth every 8 (eight) hours as needed for nausea or vomiting. 30 tablet 0   triamcinolone cream (KENALOG) 0.5 % Apply topically.     No current facility-administered medications for this visit.    Current Outpatient Medications:    acetaminophen (TYLENOL) 325 MG tablet, Take 325 mg by mouth daily as needed for moderate pain or headache., Disp: , Rfl:    ALPRAZolam (XANAX) 0.5 MG tablet, TAKE 1 TABLET(0.5 MG) BY MOUTH AT BEDTIME AS NEEDED, Disp: 30 tablet, Rfl: 3   atorvastatin (LIPITOR) 20 MG tablet, TAKE 1 TABLET BY MOUTH DAILY, Disp: 90 tablet, Rfl: 1   folic acid (FOLVITE) 1 MG tablet, Take 1 mg by mouth daily., Disp: , Rfl:    hydrocortisone (ANUSOL-HC) 25 MG suppository, Place 1 suppository (25 mg total) rectally 2 (two) times daily. For  14 days, Disp: 28 suppository, Rfl: 1   linaclotide (LINZESS) 290 MCG CAPS capsule, Take 1 capsule (290 mcg total) by mouth daily before breakfast., Disp: 30 capsule, Rfl: 3   losartan (COZAAR) 100 MG tablet, TAKE 1 TABLET(100 MG) BY MOUTH DAILY, Disp: 90 tablet, Rfl: 1   methotrexate 50 MG/2ML injection, SMARTSIG:0.4 Milliliter(s) SUB-Q Once a Week, Disp: , Rfl:    omeprazole (PRILOSEC) 40 MG capsule, Take 1 capsule (40 mg total) by mouth in the morning and at bedtime. (Patient taking differently: Take 40 mg by mouth daily as needed.), Disp: 60 capsule, Rfl: 9   ondansetron (ZOFRAN) 4 MG tablet, Take 1 tablet (4 mg total) by mouth every 8 (eight) hours as needed for nausea or vomiting., Disp: 30 tablet, Rfl: 0    triamcinolone cream (KENALOG) 0.5 %, Apply topically., Disp: , Rfl:  Allergies  Allergen Reactions   Sulfa Antibiotics Nausea Only   Suprep [Na Sulfate-K Sulfate-Mg Sulf]     VOMITING   Family History  Problem Relation Age of Onset   Stroke Mother    Heart disease Mother    Lung cancer Father        smoker-pipe   Heart disease Sister    Colon cancer Neg Hx    Esophageal cancer Neg Hx    Inflammatory bowel disease Neg Hx    Liver disease Neg Hx    Pancreatic cancer Neg Hx    Rectal cancer Neg Hx    Stomach cancer Neg Hx    Social History   Socioeconomic History   Marital status: Married    Spouse name: Not on file   Number of children: 1   Years of education: Not on file   Highest education level: Not on file  Occupational History   Occupation: retired  Tobacco Use   Smoking status: Every Day    Current packs/day: 1.00    Types: Cigarettes   Smokeless tobacco: Never  Vaping Use   Vaping status: Never Used  Substance and Sexual Activity   Alcohol use: Not Currently    Comment: Occasional   Drug use: Yes    Types: Marijuana    Comment: "once in a blue moon"   Sexual activity: Yes    Birth control/protection: None  Other Topics Concern   Not on file  Social History Narrative   Not on file   Social Determinants of Health   Financial Resource Strain: Low Risk  (05/12/2022)   Received from [facility], [facility]   Overall Financial Resource Strain (CARDIA)    Difficulty of Paying Living Expenses: Not hard at all  Food Insecurity: No Food Insecurity (05/12/2022)   Received from [facility], [facility]   Hunger Vital Sign    Worried About Running Out of Food in the Last Year: Never true    Ran Out of Food in the Last Year: Never true  Transportation Needs: No Transportation Needs (05/12/2022)   Received from [facility], [facility]   PRAPARE - Transportation    Lack of Transportation (Medical): No    Lack of Transportation (Non-Medical):  No  Physical Activity: Inactive (05/12/2022)   Received from [facility], [facility]   Exercise Vital Sign    Days of Exercise per Week: 0 days    Minutes of Exercise per Session: 0 min  Stress: No Stress Concern Present (05/12/2022)   Received from [facility], [facility]   [place]  [employer] - Occupational Stress Questionnaire    Feeling of Stress : Not at all  Social Connections: Moderately Isolated (05/12/2022)   Received from [facility], [facility]   Social Connection and Isolation Panel [NHANES]    Frequency of Communication with Friends and Family: Once a week    Frequency of Social Gatherings with Friends and Family: Once a week    Attends Religious Services: 1 to 4 times per year    Active Member of [employer] or Organizations: No    Attends Banker Meetings: Never    Marital Status: Married  Catering manager Violence: Not At Risk (11/10/2021)   Received from [facility], [facility]   Humiliation, Afraid, Rape, and Kick questionnaire    Fear of Current or Ex-Partner: No    Emotionally Abused: No    Physically Abused: No    Sexually Abused: No    Physical Exam: There were no vitals filed for this visit. There is no height or weight on file to calculate BMI. GEN: NAD EYE: Sclerae anicteric ENT: MMM CV: Non-tachycardic GI: Soft, NT/ND NEURO:  Alert & Oriented x 3  Lab Results: No results for input(s): "WBC", "HGB", "HCT", "PLT" in the last 72 hours. BMET No results for input(s): "NA", "K", "CL", "CO2", "GLUCOSE", "BUN", "CREATININE", "CALCIUM" in the last 72 hours. LFT No results for input(s): "PROT", "ALBUMIN", "AST", "ALT", "ALKPHOS", "BILITOT", "BILIDIR", "IBILI" in the last 72 hours. PT/INR No results for input(s): "LABPROT", "INR" in the last 72 hours.   Impression / Plan: This is a 67 y.o.female who presents for EGD/Colonoscopy for Dysphagia and followup dilation with prior history of  adenomas.  The risks and benefits of endoscopic evaluation/treatment were discussed with the patient and/or family; these include but are not limited to the risk of perforation, infection, bleeding, missed lesions, lack of diagnosis, severe illness requiring hospitalization, as well as anesthesia and sedation related illnesses.  The patient's history has been reviewed, patient examined, no change in status, and deemed stable for procedure.  The patient and/or family is agreeable to proceed.    Corliss Parish, MD [facility] Office # [phone]

## 2022-11-04 ENCOUNTER — Telehealth: Payer: Self-pay | Admitting: *Deleted

## 2022-11-04 NOTE — Telephone Encounter (Signed)
  Follow up Call-     11/03/2022   12:24 PM 03/31/2022   12:44 PM  Call back number  Post procedure Call Back phone  # [phone]  Permission to leave phone message Yes Yes     Patient questions:  Do you have a fever, pain , or abdominal swelling? No. Pain Score  0 *  Have you tolerated food without any problems? Yes.    Have you been able to return to your normal activities? Yes.    Do you have any questions about your discharge instructions: Diet   No. Medications  No. Follow up visit  No.  Do you have questions or concerns about your Care? No.  Actions: * If pain score is 4 or above: No action needed, pain <4.

## 2022-11-06 ENCOUNTER — Encounter: Payer: Self-pay | Admitting: Gastroenterology

## 2022-11-06 LAB — SURGICAL PATHOLOGY

## 2023-06-14 ENCOUNTER — Other Ambulatory Visit: Payer: Self-pay | Admitting: Gastroenterology

## 2023-10-06 NOTE — H&P (Signed)
 Surgical History & Physical  Patient Name: Lauren Harrison  DOB: 12-10-1955  Surgery: Cataract extraction with intraocular lens implant phacoemulsification; Right Eye Surgeon: Lauren Cleverly MD Surgery Date: 10/15/2023 Pre-Op Date: 08/25/2023  HPI: A 64 Yr. old female patient 1.  The patient is here for a Cataract Evaluation. The patient complains of difficulty when reading fine print, books, newspaper, instructions etc., which began 1 year ago. Both eyes are affected. The episode is constant. The patient describes foggy symptoms affecting their eyes/vision. The condition's severity is worsening. This is negatively affecting the patient's quality of life and the patient is unable to function adequately in life with the current level of vision. HPI was performed by Lauren Harrison .  Medical History: Macula Degeneration Cataracts  Arthritis High Blood Pressure  Review of Systems Cardiovascular High Blood Pressure, high cholesterol Musculoskeletal arthritis All recorded systems are negative except as noted above.  Social Current every day smoker   Medication Fluorometholone, Prednisolone-moxiflox-bromfen,  Atorvastatin , Triamcinolone  acetonide, Alprazolam , Pantoprazole , Losartan   Sx/Procedures None  Drug Allergies  Sulfa   History & Physical: Heent: cataracts NECK: supple without bruits LUNGS: lungs clear to auscultation CV: regular rate and rhythm Abdomen: soft and non-tender  Impression & Plan: Assessment: 1.  KERATOCONJUNCTIVITIS SICCA NOT SPECIFIED AS SJORGRENS; Both Eyes (Y83.776) 2.  CONJUNCTIVITIS ALLERGIC CHRONIC OTHER ; Both Eyes (H10.45) 3.  CATARACT NUCLEAR SCLEROSIS AGE RELATED; Both Eyes (H25.13) 4.  AGE RELATED MACULAR DEGENERATION DRY; Both Eyes Adv atrophic w/o subfoveal Involvement (H35.3133)  Plan: 1.  Dry eye. Mild signs of dry eye at this time. Can use artificial tears QID OU PRN and warm compresses once daily as needed.  2.  Discussed allergy  treatment in detail. Block allergic response: Topical antihistamine for allergic conjunctivitis, oral antihistamine,  3.  Cataracts are visually significant and account for the patient's complaints. Discussed all risks, benefits, procedures and recovery, including infection, loss of vision and eye, need for glasses after surgery or additional procedures. Patient understands changing glasses will not improve vision. Patient indicated understanding of procedure. All questions answered. Patient desires to have surgery, recommend phacoemulsification with intraocular lens. Patient to have preliminary testing necessary (Argos/IOL Master, Mac OCT, TOPO) Educational materials provided:Cataract.  Plan: - Return for measurements after using drops for 2 weeks - Proceed with cataract surgery OD followed by OS - Plan for best distance target with DIB00 - No DM, no fuchs, no prior eye surgery - AMD with central drusen, - good dilation - Dextenza  if available  4.  Dry AMD mild/moderate with fine drusen within central macula. OCT mac obtained today. Diagnosis reviewed with patient. Suggest amsler grid check weekly or so and AREDs vitamin.  Follows with Dr. Nonah in [place]

## 2023-10-12 ENCOUNTER — Encounter (HOSPITAL_COMMUNITY)
Admission: RE | Admit: 2023-10-12 | Discharge: 2023-10-12 | Disposition: A | Discharge status: Home / Self Care | Source: Ambulatory Visit | Attending: Optometry | Admitting: Optometry

## 2023-10-12 ENCOUNTER — Encounter (HOSPITAL_COMMUNITY): Payer: Self-pay

## 2023-10-12 HISTORY — DX: Chronic obstructive pulmonary disease, unspecified: J44.9

## 2023-10-15 ENCOUNTER — Encounter (HOSPITAL_COMMUNITY)
Admission: RE | Disposition: A | Discharge status: Home / Self Care | Payer: Self-pay | Source: Home / Self Care | Attending: Optometry

## 2023-10-15 ENCOUNTER — Encounter (HOSPITAL_COMMUNITY): Payer: Self-pay | Admitting: Optometry

## 2023-10-15 ENCOUNTER — Other Ambulatory Visit: Payer: Self-pay

## 2023-10-15 ENCOUNTER — Ambulatory Visit (HOSPITAL_COMMUNITY)
Admission: RE | Admit: 2023-10-15 | Discharge: 2023-10-15 | Disposition: A | Discharge status: Home / Self Care | Attending: Optometry | Admitting: Optometry

## 2023-10-15 ENCOUNTER — Ambulatory Visit (HOSPITAL_COMMUNITY): Admitting: Certified Registered Nurse Anesthetist

## 2023-10-15 ENCOUNTER — Ambulatory Visit (HOSPITAL_BASED_OUTPATIENT_CLINIC_OR_DEPARTMENT_OTHER): Admitting: Certified Registered Nurse Anesthetist

## 2023-10-15 DIAGNOSIS — J449 Chronic obstructive pulmonary disease, unspecified: Secondary | ICD-10-CM | POA: Diagnosis not present

## 2023-10-15 DIAGNOSIS — K219 Gastro-esophageal reflux disease without esophagitis: Secondary | ICD-10-CM | POA: Diagnosis not present

## 2023-10-15 DIAGNOSIS — I10 Essential (primary) hypertension: Secondary | ICD-10-CM

## 2023-10-15 DIAGNOSIS — H2511 Age-related nuclear cataract, right eye: Secondary | ICD-10-CM

## 2023-10-15 DIAGNOSIS — H353133 Nonexudative age-related macular degeneration, bilateral, advanced atrophic without subfoveal involvement: Secondary | ICD-10-CM | POA: Diagnosis not present

## 2023-10-15 DIAGNOSIS — K449 Diaphragmatic hernia without obstruction or gangrene: Secondary | ICD-10-CM | POA: Insufficient documentation

## 2023-10-15 DIAGNOSIS — F17200 Nicotine dependence, unspecified, uncomplicated: Secondary | ICD-10-CM | POA: Diagnosis not present

## 2023-10-15 DIAGNOSIS — H2513 Age-related nuclear cataract, bilateral: Secondary | ICD-10-CM | POA: Diagnosis not present

## 2023-10-15 DIAGNOSIS — H16223 Keratoconjunctivitis sicca, not specified as Sjogren's, bilateral: Secondary | ICD-10-CM | POA: Insufficient documentation

## 2023-10-15 DIAGNOSIS — F17210 Nicotine dependence, cigarettes, uncomplicated: Secondary | ICD-10-CM | POA: Insufficient documentation

## 2023-10-15 HISTORY — PX: CATARACT EXTRACTION W/PHACO: SHX586

## 2023-10-15 SURGERY — PHACOEMULSIFICATION, CATARACT, WITH IOL INSERTION
Anesthesia: Monitor Anesthesia Care | Site: Eye | Laterality: Right

## 2023-10-15 MED ORDER — POVIDONE-IODINE 5 % OP SOLN
OPHTHALMIC | Status: DC | PRN
  Administered 2023-10-15: 1 via OPHTHALMIC

## 2023-10-15 MED ORDER — FENTANYL CITRATE (PF) 100 MCG/2ML IJ SOLN
INTRAMUSCULAR | Status: AC
  Filled 2023-10-15: qty 2

## 2023-10-15 MED ORDER — TROPICAMIDE 1 % OP SOLN
1.0000 [drp] | OPHTHALMIC | Status: AC
  Administered 2023-10-15 (×3): 1 [drp] via OPHTHALMIC

## 2023-10-15 MED ORDER — SIGHTPATH DOSE#1 NA HYALUR & NA CHOND-NA HYALUR IO KIT
PACK | INTRAOCULAR | Status: DC | PRN
  Administered 2023-10-15: 1 via OPHTHALMIC

## 2023-10-15 MED ORDER — STERILE WATER FOR IRRIGATION IR SOLN
Status: DC | PRN
  Administered 2023-10-15: 10 mL

## 2023-10-15 MED ORDER — MIDAZOLAM HCL 2 MG/2ML IJ SOLN
INTRAMUSCULAR | Status: AC
  Filled 2023-10-15: qty 2

## 2023-10-15 MED ORDER — TETRACAINE HCL 0.5 % OP SOLN
1.0000 [drp] | OPHTHALMIC | Status: AC
  Administered 2023-10-15 (×3): 1 [drp] via OPHTHALMIC

## 2023-10-15 MED ORDER — LIDOCAINE HCL (PF) 1 % IJ SOLN
INTRAMUSCULAR | Status: DC | PRN
  Administered 2023-10-15: 1 mL

## 2023-10-15 MED ORDER — PHENYLEPHRINE-KETOROLAC 1-0.3 % IO SOLN
INTRAOCULAR | Status: DC | PRN
  Administered 2023-10-15: 500 mL via OPHTHALMIC

## 2023-10-15 MED ORDER — PHENYLEPHRINE HCL 2.5 % OP SOLN
1.0000 [drp] | OPHTHALMIC | Status: AC
  Administered 2023-10-15 (×3): 1 [drp] via OPHTHALMIC

## 2023-10-15 MED ORDER — LIDOCAINE HCL 3.5 % OP GEL
1.0000 | Freq: Once | OPHTHALMIC | Status: AC
  Administered 2023-10-15: 1 via OPHTHALMIC

## 2023-10-15 MED ORDER — FENTANYL CITRATE (PF) 100 MCG/2ML IJ SOLN
INTRAMUSCULAR | Status: DC | PRN
  Administered 2023-10-15: 50 ug via INTRAVENOUS

## 2023-10-15 MED ORDER — BSS IO SOLN
INTRAOCULAR | Status: DC | PRN
  Administered 2023-10-15: 15 mL via INTRAOCULAR

## 2023-10-15 MED ORDER — MOXIFLOXACIN HCL 5 MG/ML IO SOLN
INTRAOCULAR | Status: DC | PRN
  Administered 2023-10-15: .3 mL via INTRACAMERAL

## 2023-10-15 MED ORDER — MIDAZOLAM HCL 2 MG/2ML IJ SOLN
INTRAMUSCULAR | Status: DC | PRN
  Administered 2023-10-15: 1 mg via INTRAVENOUS

## 2023-10-15 SURGICAL SUPPLY — 13 items
CLOTH BEACON ORANGE TIMEOUT ST (SAFETY) ×2 IMPLANT
DRSG TEGADERM 4X4.75 (GAUZE/BANDAGES/DRESSINGS) ×2 IMPLANT
EYE SHIELD UNIVERSAL CLEAR (GAUZE/BANDAGES/DRESSINGS) IMPLANT
FEE CATARACT SUITE SIGHTPATH (MISCELLANEOUS) ×2 IMPLANT
GLOVE BIOGEL PI IND STRL 6.5 (GLOVE) IMPLANT
GLOVE BIOGEL PI IND STRL 7.0 (GLOVE) ×4 IMPLANT
LENS IOL TECNIS EYHANCE 20.0 (Intraocular Lens) IMPLANT
NDL HYPO 18GX1.5 BLUNT FILL (NEEDLE) ×2 IMPLANT
NEEDLE HYPO 18GX1.5 BLUNT FILL (NEEDLE) ×1 IMPLANT
PAD ARMBOARD POSITIONER FOAM (MISCELLANEOUS) ×2 IMPLANT
SYR TB 1ML LL NO SAFETY (SYRINGE) ×2 IMPLANT
TAPE SURG TRANSPORE 1 IN (GAUZE/BANDAGES/DRESSINGS) IMPLANT
WATER STERILE IRR 250ML POUR (IV SOLUTION) ×2 IMPLANT

## 2023-10-15 NOTE — Discharge Instructions (Signed)
 Please discharge patient when stable, will follow up today with Dr. Ilsa Iha at the [facility] office immediately following discharge.  Leave shield in place until visit.  All paperwork with discharge instructions will be given at the office.  [facility] Address:  [address]  [place], [place] [address]  Dr. Chaya Jan Phone: [phone]

## 2023-10-15 NOTE — Interval H&P Note (Signed)
 History and Physical Interval Note:  10/15/2023 7:50 AM  The H and P was reviewed and updated. The patient was examined.  No changes were found after exam.  The surgical eye was marked.  Lauren Harrison

## 2023-10-15 NOTE — Transfer of Care (Signed)
 Immediate Anesthesia Transfer of Care Note  Patient: Lauren Harrison  Procedure(s) Performed: PHACOEMULSIFICATION, CATARACT, WITH IOL INSERTION (Right: Eye)  Patient Location: Short Stay  Anesthesia Type:MAC  Level of Consciousness: awake, alert , and oriented  Airway & Oxygen Therapy: Patient Spontanous Breathing  Post-op Assessment: Report given to RN, Post -op Vital signs reviewed and stable, Patient moving all extremities X 4, and Patient able to stick tongue midline  Post vital signs: Reviewed and stable  Last Vitals:  Vitals Value Taken Time  BP 108/45   Temp 97.8   Pulse 64   Resp 17   SpO2 98     Last Pain:  Vitals:   10/15/23 0736  TempSrc: Oral  PainSc: 0-No pain         Complications: No notable events documented.

## 2023-10-15 NOTE — Op Note (Signed)
 Date of procedure: 10/15/23  Pre-operative diagnosis: Visually significant age-related nuclear cataract, Right Eye (H25.11)  Post-operative diagnosis: Visually significant age-related nuclear cataract, Right Eye H25.11  Procedure: Removal of cataract via phacoemulsification and insertion of intra-ocular lens J&J DIBOO +20.0D into the capsular bag of the Right Eye  Attending surgeon: [name], MD  Anesthesia: MAC, Topical Akten   Complications: None  Estimated Blood Loss: <87mL (minimal)  Specimens: None  Implants:  Implant Name Type Inv. Item Serial No. Manufacturer Lot No. LRB No. Used Action  LENS IOL TECNIS EYHANCE 20.0 - [id number] Intraocular Lens LENS IOL TECNIS EYHANCE 20.0 [id number] SIGHTPATH  Right 1 Implanted    Indications:  Visually significant age-related cataract, Right Eye  Procedure:  The patient was seen and identified in the pre-operative area. The operative eye was identified and dilated.  The operative eye was marked.  Topical anesthesia was administered to the operative eye.     The patient was then to the operative suite and placed in the supine position.  A timeout was performed confirming the patient, procedure to be performed, and all other relevant information.   The patient's face was prepped and draped in the usual fashion for intra-ocular surgery.  A lid speculum was placed into the operative eye and the surgical microscope moved into place and focused.  A superotemporal paracentesis was created using a 20 gauge paracentesis blade.  BSS mixed with Omidria , followed by 1% lidocaine  was injected into the anterior chamber.  Viscoelastic was injected into the anterior chamber.  A temporal clear-corneal main wound incision was created using a 2.71mm microkeratome.  A continuous curvilinear capsulorrhexis was initiated using an irrigating cystitome and completed using capsulorrhexis forceps.  Hydrodissection and hydrodeliniation were performed.  Viscoelastic  was injected into the anterior chamber.  A phacoemulsification handpiece and a chopper as a second instrument were used to remove the nucleus and epinucleus. The irrigation/aspiration handpiece was used to remove any remaining cortical material.   The capsular bag was reinflated with viscoelastic, checked, and found to be intact.  The intraocular lens was inserted into the capsular bag.  The irrigation/aspiration handpiece was used to remove any remaining viscoelastic.  The clear corneal wound and paracentesis wounds were then hydrated and checked with Weck-Cels to be watertight. Moxifloxacin  was instilled into the anterior chamber.  The lid-speculum and drape were removed.  The patient's face was cleaned with a wet and dry 4x4. A clear shield was taped over the eye. The patient was taken to the post-operative care unit in good condition, having tolerated the procedure well.  Post-Op Instructions: The patient will follow up at [facility] for a same day post-operative evaluation and will receive all other orders and instructions.

## 2023-10-15 NOTE — Anesthesia Postprocedure Evaluation (Signed)
 Anesthesia Post Note  Patient: Lauren Harrison  Procedure(s) Performed: PHACOEMULSIFICATION, CATARACT, WITH IOL INSERTION (Right: Eye)  Patient location during evaluation: PACU Anesthesia Type: MAC Level of consciousness: awake and alert Pain management: pain level controlled Vital Signs Assessment: post-procedure vital signs reviewed and stable Respiratory status: spontaneous breathing, nonlabored ventilation, respiratory function stable and patient connected to nasal cannula oxygen Cardiovascular status: stable and blood pressure returned to baseline Postop Assessment: no apparent nausea or vomiting Anesthetic complications: no   No notable events documented.   Last Vitals:  Vitals:   10/15/23 0736 10/15/23 0845  BP: 112/67 105/73  Pulse: 72 64  Resp: 13 16  Temp: 36.6 C 36.6 C  SpO2: 100% 97%    Last Pain:  Vitals:   10/15/23 0845  TempSrc: Oral  PainSc: 0-No pain                 Andrea Limes

## 2023-10-15 NOTE — Anesthesia Preprocedure Evaluation (Addendum)
 Anesthesia Evaluation  Patient identified by MRN, date of birth, ID band Patient awake    Reviewed: Allergy & Precautions, H&P , NPO status , Patient's Chart, lab work & pertinent test results  Airway Mallampati: II  TM Distance: >3 FB Neck ROM: Full    Dental  (+) Edentulous Upper, Edentulous Lower   Pulmonary COPD, Current Smoker and Patient abstained from smoking.  Mild end expiratory wheeaing   + wheezing      Cardiovascular hypertension, Normal cardiovascular exam Rhythm:Regular Rate:Normal     Neuro/Psych   Anxiety      Neuromuscular disease  negative psych ROS   GI/Hepatic Neg liver ROS, hiatal hernia,GERD  ,,  Endo/Other  negative endocrine ROS    Renal/GU negative Renal ROS  negative genitourinary   Musculoskeletal  (+) Arthritis ,    Abdominal   Peds negative pediatric ROS (+)  Hematology  (+) Blood dyscrasia, anemia   Anesthesia Other Findings   Reproductive/Obstetrics negative OB ROS                              Anesthesia Physical Anesthesia Plan  ASA: 3  Anesthesia Plan: MAC   Post-op Pain Management:    Induction:   PONV Risk Score and Plan:   Airway Management Planned: Nasal Cannula  Additional Equipment:   Intra-op Plan:   Post-operative Plan:   Informed Consent: I have reviewed the patients History and Physical, chart, labs and discussed the procedure including the risks, benefits and alternatives for the proposed anesthesia with the patient or authorized representative who has indicated his/her understanding and acceptance.     Dental advisory given  Plan Discussed with: CRNA  Anesthesia Plan Comments:          Anesthesia Quick Evaluation

## 2023-10-18 ENCOUNTER — Encounter (HOSPITAL_COMMUNITY): Payer: Self-pay | Admitting: Optometry

## 2023-10-27 ENCOUNTER — Other Ambulatory Visit: Payer: Self-pay

## 2023-10-27 ENCOUNTER — Encounter (HOSPITAL_COMMUNITY)
Admission: RE | Admit: 2023-10-27 | Discharge: 2023-10-27 | Disposition: A | Discharge status: Home / Self Care | Source: Ambulatory Visit | Attending: Optometry | Admitting: Optometry

## 2023-10-27 ENCOUNTER — Encounter (HOSPITAL_COMMUNITY): Payer: Self-pay

## 2023-10-27 NOTE — H&P (Signed)
 Surgical History & Physical  Patient Name: Lauren Harrison  DOB: 05-18-1955  Surgery: Cataract extraction with intraocular lens implant phacoemulsification; Left Eye Surgeon: [name] MD Surgery Date: 10/29/2023 Pre-Op Date: 10/19/2023  HPI: A 84 Yr. old female patient present for 4 day post op OD. Patient is doing well, states vision is still a little blurry. Using Combo gtt TID OD. Difficulties reading, watching TV, glare during the day, poor night vision, and reading street signs. This is negatively affecting the patient's quality of life and the patient is unable to function adequately in life with the current level of vision. Patient would like to proceed with cataract surgery. HPI was performed by [name] .  Medical History: Macula Degeneration Cataracts  Arthritis High Blood Pressure  Review of Systems Cardiovascular High Blood Pressure, high cholesterol Musculoskeletal arthritis All recorded systems are negative except as noted above.  Social Current every day smoker   Medication Fluorometholone, Prednisolone-moxiflox-bromfen,  Atorvastatin , Triamcinolone  acetonide, Alprazolam , Pantoprazole , Losartan   Sx/Procedures Phaco c IOL OD  Drug Allergies  Sulfa  (Sulfonamide Antibiotics)   History & Physical: Heent: cataract NECK: supple without bruits LUNGS: lungs clear to auscultation CV: regular rate and rhythm Abdomen: soft and non-tender  Impression & Plan: Assessment: 1.  CATARACT NUCLEAR SCLEROSIS AGE RELATED; , Left Eye (H25.12) 2.  CATARACT EXTRACTION STATUS; Right Eye (Z98.41) 3.  INTRAOCULAR LENS IOL (Z96.1)  Plan: 1.  Cataracts are visually significant and account for the patient's complaints. Discussed all risks, benefits, procedures and recovery, including infection, loss of vision and eye, need for glasses after surgery or additional procedures. Patient understands changing glasses will not improve vision. Patient indicated understanding of  procedure. All questions answered. Patient desires to have surgery, recommend phacoemulsification with intraocular lens. Patient to have preliminary testing necessary ([employer]/IOL Master, Mac OCT, TOPO) Educational materials provided:Cataract.  Plan: - Proceed with cataract surgery OS when ready - Plan for best distance target with DIB00 - No DM, no fuchs, no prior eye surgery - AMD with central drusen, limiting vision - good dilation - Dextenza  if available  2.  CE/PCIOL OD 10/15/23 POD4 exam. Doing well. All post-op precautions discussed and instructions reviewed. Written instructions given.  3.  See above

## 2023-10-29 ENCOUNTER — Ambulatory Visit (HOSPITAL_COMMUNITY): Admitting: Anesthesiology

## 2023-10-29 ENCOUNTER — Ambulatory Visit (HOSPITAL_COMMUNITY)
Admission: RE | Admit: 2023-10-29 | Discharge: 2023-10-29 | Disposition: A | Discharge status: Home / Self Care | Attending: Optometry | Admitting: Optometry

## 2023-10-29 ENCOUNTER — Ambulatory Visit (HOSPITAL_BASED_OUTPATIENT_CLINIC_OR_DEPARTMENT_OTHER): Admitting: Anesthesiology

## 2023-10-29 ENCOUNTER — Other Ambulatory Visit: Payer: Self-pay

## 2023-10-29 ENCOUNTER — Encounter (HOSPITAL_COMMUNITY)
Admission: RE | Disposition: A | Discharge status: Home / Self Care | Payer: Self-pay | Source: Home / Self Care | Attending: Optometry

## 2023-10-29 ENCOUNTER — Encounter (HOSPITAL_COMMUNITY): Payer: Self-pay | Admitting: Optometry

## 2023-10-29 DIAGNOSIS — F17200 Nicotine dependence, unspecified, uncomplicated: Secondary | ICD-10-CM | POA: Insufficient documentation

## 2023-10-29 DIAGNOSIS — Z9841 Cataract extraction status, right eye: Secondary | ICD-10-CM | POA: Insufficient documentation

## 2023-10-29 DIAGNOSIS — H2512 Age-related nuclear cataract, left eye: Secondary | ICD-10-CM

## 2023-10-29 DIAGNOSIS — I10 Essential (primary) hypertension: Secondary | ICD-10-CM

## 2023-10-29 DIAGNOSIS — K449 Diaphragmatic hernia without obstruction or gangrene: Secondary | ICD-10-CM | POA: Diagnosis not present

## 2023-10-29 DIAGNOSIS — Z961 Presence of intraocular lens: Secondary | ICD-10-CM | POA: Insufficient documentation

## 2023-10-29 DIAGNOSIS — K219 Gastro-esophageal reflux disease without esophagitis: Secondary | ICD-10-CM | POA: Diagnosis not present

## 2023-10-29 DIAGNOSIS — J449 Chronic obstructive pulmonary disease, unspecified: Secondary | ICD-10-CM

## 2023-10-29 HISTORY — PX: CATARACT EXTRACTION W/PHACO: SHX586

## 2023-10-29 SURGERY — PHACOEMULSIFICATION, CATARACT, WITH IOL INSERTION
Anesthesia: Monitor Anesthesia Care | Site: Eye | Laterality: Left

## 2023-10-29 MED ORDER — MIDAZOLAM HCL 2 MG/2ML IJ SOLN
INTRAMUSCULAR | Status: AC
  Filled 2023-10-29: qty 2

## 2023-10-29 MED ORDER — SIGHTPATH DOSE#1 NA HYALUR & NA CHOND-NA HYALUR IO KIT
PACK | INTRAOCULAR | Status: DC | PRN
  Administered 2023-10-29: 1 via OPHTHALMIC

## 2023-10-29 MED ORDER — TROPICAMIDE 1 % OP SOLN
1.0000 [drp] | OPHTHALMIC | Status: AC | PRN
  Administered 2023-10-29 (×3): 1 [drp] via OPHTHALMIC

## 2023-10-29 MED ORDER — LACTATED RINGERS IV SOLN: INTRAVENOUS | Status: DC

## 2023-10-29 MED ORDER — PHENYLEPHRINE HCL 2.5 % OP SOLN
1.0000 [drp] | OPHTHALMIC | Status: AC | PRN
  Administered 2023-10-29 (×3): 1 [drp] via OPHTHALMIC

## 2023-10-29 MED ORDER — TETRACAINE 0.5 % OP SOLN OPTIME - NO CHARGE
OPHTHALMIC | Status: DC | PRN
  Administered 2023-10-29: 1 [drp] via OPHTHALMIC

## 2023-10-29 MED ORDER — STERILE WATER FOR IRRIGATION IR SOLN
Status: DC | PRN
  Administered 2023-10-29: 1

## 2023-10-29 MED ORDER — FENTANYL CITRATE (PF) 100 MCG/2ML IJ SOLN
INTRAMUSCULAR | Status: AC
  Filled 2023-10-29: qty 2

## 2023-10-29 MED ORDER — LIDOCAINE HCL (PF) 1 % IJ SOLN
INTRAMUSCULAR | Status: DC | PRN
  Administered 2023-10-29: 1 mL

## 2023-10-29 MED ORDER — MIDAZOLAM HCL 2 MG/2ML IJ SOLN
INTRAMUSCULAR | Status: DC | PRN
  Administered 2023-10-29: 1 mg via INTRAVENOUS

## 2023-10-29 MED ORDER — BSS IO SOLN
INTRAOCULAR | Status: DC | PRN
  Administered 2023-10-29: 15 mL via INTRAOCULAR

## 2023-10-29 MED ORDER — TETRACAINE HCL 0.5 % OP SOLN
1.0000 [drp] | OPHTHALMIC | Status: AC | PRN
  Administered 2023-10-29 (×3): 1 [drp] via OPHTHALMIC

## 2023-10-29 MED ORDER — PHENYLEPHRINE-KETOROLAC 1-0.3 % IO SOLN
INTRAOCULAR | Status: DC | PRN
  Administered 2023-10-29: 500 mL via OPHTHALMIC

## 2023-10-29 MED ORDER — POVIDONE-IODINE 5 % OP SOLN
OPHTHALMIC | Status: DC | PRN
  Administered 2023-10-29: 1 via OPHTHALMIC

## 2023-10-29 MED ORDER — MOXIFLOXACIN HCL 5 MG/ML IO SOLN
INTRAOCULAR | Status: DC | PRN
  Administered 2023-10-29: .2 mL via INTRACAMERAL

## 2023-10-29 MED ORDER — SODIUM CHLORIDE 0.9% FLUSH
INTRAVENOUS | Status: DC | PRN
  Administered 2023-10-29: 10 mL via INTRAVENOUS

## 2023-10-29 MED ORDER — FENTANYL CITRATE (PF) 100 MCG/2ML IJ SOLN
INTRAMUSCULAR | Status: DC | PRN
  Administered 2023-10-29: 50 ug via INTRAVENOUS

## 2023-10-29 MED ORDER — LIDOCAINE HCL 3.5 % OP GEL
1.0000 | Freq: Once | OPHTHALMIC | Status: AC
  Administered 2023-10-29: 1 via OPHTHALMIC

## 2023-10-29 SURGICAL SUPPLY — 13 items
CLOTH BEACON ORANGE TIMEOUT ST (SAFETY) ×2 IMPLANT
DRSG TEGADERM 4X4.75 (GAUZE/BANDAGES/DRESSINGS) ×2 IMPLANT
EYE SHIELD UNIVERSAL CLEAR (GAUZE/BANDAGES/DRESSINGS) IMPLANT
FEE CATARACT SUITE SIGHTPATH (MISCELLANEOUS) ×2 IMPLANT
GLOVE BIOGEL PI IND STRL 7.0 (GLOVE) ×4 IMPLANT
GOWN STRL REUS W/TWL LRG LVL3 (GOWN DISPOSABLE) IMPLANT
LENS IOL TECNIS EYHANCE 17.5 (Intraocular Lens) IMPLANT
NDL HYPO 18GX1.5 BLUNT FILL (NEEDLE) ×2 IMPLANT
NEEDLE HYPO 18GX1.5 BLUNT FILL (NEEDLE) ×1 IMPLANT
PAD ARMBOARD POSITIONER FOAM (MISCELLANEOUS) ×2 IMPLANT
SYR TB 1ML LL NO SAFETY (SYRINGE) ×2 IMPLANT
TAPE SURG TRANSPORE 1 IN (GAUZE/BANDAGES/DRESSINGS) IMPLANT
WATER STERILE IRR 250ML POUR (IV SOLUTION) ×2 IMPLANT

## 2023-10-29 NOTE — Interval H&P Note (Signed)
 History and Physical Interval Note:  10/29/2023 10:53 AM  The H and P was reviewed and updated. The patient was examined.  No changes were found after exam.  The surgical eye was marked.  Fredick Schlosser

## 2023-10-29 NOTE — Discharge Instructions (Signed)
 Please discharge patient when stable, will follow up today with Dr. Ilsa Iha at the [facility] office immediately following discharge.  Leave shield in place until visit.  All paperwork with discharge instructions will be given at the office.  [facility] Address:  [address]  [place], [place] [address]  Dr. Chaya Jan Phone: [phone]

## 2023-10-29 NOTE — Transfer of Care (Signed)
 Immediate Anesthesia Transfer of Care Note  Patient: Lauren Harrison  Procedure(s) Performed: PHACOEMULSIFICATION, CATARACT, WITH IOL INSERTION (Left: Eye)  Patient Location: Short Stay  Anesthesia Type:MAC  Level of Consciousness: awake, alert , oriented, and patient cooperative  Airway & Oxygen Therapy: Patient Spontanous Breathing  Post-op Assessment: Report given to RN, Post -op Vital signs reviewed and stable, and Patient moving all extremities X 4  Post vital signs: Reviewed and stable  Last Vitals:  Vitals Value Taken Time  BP    Temp    Pulse    Resp    SpO2      Last Pain: There were no vitals filed for this visit.       Complications: No notable events documented.

## 2023-10-29 NOTE — Anesthesia Preprocedure Evaluation (Signed)
 Anesthesia Evaluation  Patient identified by MRN, date of birth, ID band Patient awake    Reviewed: Allergy & Precautions, H&P , NPO status , Patient's Chart, lab work & pertinent test results  Airway Mallampati: II  TM Distance: >3 FB Neck ROM: Full    Dental  (+) Edentulous Upper, Edentulous Lower   Pulmonary COPD, Current Smoker and Patient abstained from smoking.  Mild end expiratory wheeaing   + wheezing      Cardiovascular hypertension, Normal cardiovascular exam Rhythm:Regular Rate:Normal     Neuro/Psych   Anxiety      Neuromuscular disease  negative psych ROS   GI/Hepatic Neg liver ROS, hiatal hernia,GERD  ,,  Endo/Other  negative endocrine ROS    Renal/GU negative Renal ROS  negative genitourinary   Musculoskeletal  (+) Arthritis ,    Abdominal   Peds negative pediatric ROS (+)  Hematology  (+) Blood dyscrasia, anemia   Anesthesia Other Findings   Reproductive/Obstetrics negative OB ROS                              Anesthesia Physical Anesthesia Plan  ASA: 3  Anesthesia Plan: MAC   Post-op Pain Management:    Induction:   PONV Risk Score and Plan:   Airway Management Planned: Nasal Cannula  Additional Equipment:   Intra-op Plan:   Post-operative Plan:   Informed Consent: I have reviewed the patients History and Physical, chart, labs and discussed the procedure including the risks, benefits and alternatives for the proposed anesthesia with the patient or authorized representative who has indicated his/her understanding and acceptance.     Dental advisory given  Plan Discussed with: CRNA  Anesthesia Plan Comments:          Anesthesia Quick Evaluation

## 2023-10-29 NOTE — Anesthesia Postprocedure Evaluation (Signed)
 Anesthesia Post Note  Patient: Lauren Harrison  Procedure(s) Performed: PHACOEMULSIFICATION, CATARACT, WITH IOL INSERTION (Left: Eye)  Patient location during evaluation: Phase II Anesthesia Type: MAC Level of consciousness: awake Pain management: pain level controlled Vital Signs Assessment: post-procedure vital signs reviewed and stable Respiratory status: spontaneous breathing and respiratory function stable Cardiovascular status: blood pressure returned to baseline and stable Postop Assessment: no headache and no apparent nausea or vomiting Anesthetic complications: no Comments: Late entry   No notable events documented.   Last Vitals:  Vitals:   10/29/23 1215  BP: (!) 156/79  Pulse: 76  Resp: 13  Temp: 36.6 C  SpO2: 100%    Last Pain:  Vitals:   10/29/23 1215  TempSrc: Oral  PainSc: 0-No pain                 Yvonna JINNY Bosworth

## 2023-10-29 NOTE — Op Note (Signed)
 Date of procedure: 10/29/23  Pre-operative diagnosis: Visually significant age-related nuclear cataract, Left Eye (H25.12)  Post-operative diagnosis: Visually significant age-related nuclear cataract, Left Eye H25.12  Procedure: Removal of cataract via phacoemulsification and insertion of intra-ocular lens J&J DIB00 +17.5D into the capsular bag of the Left Eye  Attending surgeon: Marsa JINNY Cleverly, MD  Anesthesia: MAC, Topical Akten   Complications: None  Estimated Blood Loss: <19mL (minimal)  Specimens: None  Implants:  Implant Name Type Inv. Item Serial No. Manufacturer Lot No. LRB No. Used Action  LENS IOL TECNIS EYHANCE 17.5 - [id number] Intraocular Lens LENS IOL TECNIS EYHANCE 17.5 [id number] [employer]  Left 1 Implanted    Indications:  Visually significant age-related cataract, Left Eye  Procedure:  The patient was seen and identified in the pre-operative area. The operative eye was identified and dilated.  The operative eye was marked.  Topical anesthesia was administered to the operative eye.     The patient was then to the operative suite and placed in the supine position.  A timeout was performed confirming the patient, procedure to be performed, and all other relevant information.   The patient's face was prepped and draped in the usual fashion for intra-ocular surgery.  A lid speculum was placed into the operative eye and the surgical microscope moved into place and focused.  An inferotemporal paracentesis was created using a 20 gauge paracentesis blade.  BSS mixed with [place] , followed by 1% lidocaine  was injected into the anterior chamber.  Viscoelastic was injected into the anterior chamber.  A temporal clear-corneal main wound incision was created using a 2.39mm microkeratome.  A continuous curvilinear capsulorrhexis was initiated using an irrigating cystitome and completed using capsulorrhexis forceps.  Hydrodissection and hydrodeliniation were performed.  Viscoelastic  was injected into the anterior chamber.  A phacoemulsification handpiece and a chopper as a second instrument were used to remove the nucleus and epinucleus. The irrigation/aspiration handpiece was used to remove any remaining cortical material.   The capsular bag was reinflated with viscoelastic, checked, and found to be intact.  The intraocular lens was inserted into the capsular bag.  The irrigation/aspiration handpiece was used to remove any remaining viscoelastic.  The clear corneal wound and paracentesis wounds were then hydrated and checked with Weck-Cels to be watertight. Moxifloxacin  was instilled into the anterior chamber.  The lid-speculum and drape were removed. The patient's face was cleaned with a wet and dry 4x4.  A clear shield was taped over the eye. The patient was taken to the post-operative care unit in good condition, having tolerated the procedure well.  Post-Op Instructions: The patient will follow up at [facility] for a same day post-operative evaluation and will receive all other orders and instructions.

## 2024-02-16 NOTE — Progress Notes (Signed)
 "  Office Visit Note  Patient: Lauren Harrison             Date of Birth: Nov 01, 1955           MRN: [id number]             PCP: Marlee Lynwood NOVAK, MD Referring: Drew Domino, NP Visit Date: 02/23/2024 Occupation: Data Unavailable  Subjective:  History of rheumatoid arthritis, medication management  History of Present Illness: Lauren Harrison is a 69 y.o. female seen for the evaluation of rheumatoid arthritis.  According the patient her symptoms started approximately 5 years ago with pain and swelling in her hands.  She states her daughter scheduled an appointment with Dr. Fae who diagnosed her with and rheumatoid arthritis.  She states she was started on methotrexate 5 tablets a week along with folic acid .  She was also taking vitamin D and Xanax .  She states her symptoms improved after taking methotrexate and she stayed on the same dose of methotrexate over the years.  She states Dr. Maree of left the practice and now he does not except her insurance thus the reason she is trying to find another provider.  She has intermittent swelling in her hands.  None of the other joints are painful or swollen.  She has some stiffness in her neck and in her feet.  She states she has had discomfort in her knee joints off-and-on.  She states her right knee joint gives out on her at times.  She gives history of morning stiffness and nocturnal pain.  She has no difficulty climbing stairs and doing routine activities.  She denies any shortness of breath.  She has dry skin over her elbow.  Patient states she has been seeing a dermatologist.  She is not sure if she has eczema or psoriasis.  She has an appointment coming up with the dermatologist again. 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  She enjoys playing bingo.  She likes to walk and also does yard work.  She is married, gravida 1, para 1.  There is no history of preeclampsia or DVTs.  She used to drink alcohol but quit drinking  several years ago.  She has been smoking 1 pack/day previously 2 packs/day.  She started smoking at age 32.  There is no family history of rheumatoid arthritis.  She states she had a DEXA scan this year but she does not know the results.  She will have a follow-up visit with her PCP.    Activities of Daily Living:  Patient reports morning stiffness for all day.  Patient Reports nocturnal pain.  Difficulty dressing/grooming: Denies Difficulty climbing stairs: Denies Difficulty getting out of chair: Denies Difficulty using hands for taps, buttons, cutlery, and/or writing: Denies  Review of Systems  Constitutional:  Positive for fatigue.  HENT:  Negative for mouth sores and mouth dryness.   Eyes:  Negative for dryness.  Respiratory:  Negative for shortness of breath.   Cardiovascular:  Negative for chest pain and palpitations.  Gastrointestinal:  Negative for blood in stool, constipation and diarrhea.  Endocrine: Negative for increased urination.  Genitourinary:  Negative for involuntary urination.  Musculoskeletal:  Positive for joint pain, joint pain, myalgias, muscle weakness, morning stiffness, muscle tenderness and myalgias. Negative for gait problem and joint swelling.  Skin:  Negative for color change, rash, hair loss and sensitivity to sunlight.  Allergic/Immunologic: Negative for susceptible to infections.  Neurological:  Negative for  dizziness and headaches.  Hematological:  Negative for swollen glands.  Psychiatric/Behavioral:  Positive for sleep disturbance. Negative for depressed mood. The patient is not nervous/anxious.     PMFS History:  Patient Active Problem List   Diagnosis Date Noted   Hx of adenomatous colonic polyps 06/06/2022   Chronic constipation 06/06/2022   Schatzki's ring 06/06/2022   Esophageal dysphagia 06/06/2022   Hiatal hernia 06/06/2022   History of anemia 06/06/2022   Abdominal discomfort in right lower quadrant 06/06/2022   Psoriasis    IDA  (iron deficiency anemia) 04/29/2018   Rectal bleeding 04/29/2018   Constipation 04/29/2018   Abnormal mammogram of left breast 01/05/2014   Smoker    Insomnia     Past Medical History:  Diagnosis Date   Anxiety    Arthritis    COPD (chronic obstructive pulmonary disease) (HCC)    Full dentures    GERD (gastroesophageal reflux disease)    HLD (hyperlipidemia)    Hypertension    IDA (iron deficiency anemia)    Insomnia    Psoriasis    Psoriasis    Skin cancer of face    basal cell-face and chest   Smoker     Family History  Problem Relation Age of Onset   Stroke Mother    Heart disease Mother    Lung cancer Father        smoker-pipe   Heart disease Sister    Healthy Daughter    Colon cancer Neg Hx    Esophageal cancer Neg Hx    Inflammatory bowel disease Neg Hx    Liver disease Neg Hx    Pancreatic cancer Neg Hx    Rectal cancer Neg Hx    Stomach cancer Neg Hx    Past Surgical History:  Procedure Laterality Date   APPENDECTOMY     BREAST LUMPECTOMY WITH RADIOACTIVE SEED LOCALIZATION Left 01/05/2014   Procedure: LEFT BREAST LUMPECTOMY WITH RADIOACTIVE SEED LOCALIZATION;  Surgeon: Elon Pacini, MD;  Location: [facility];  Service: General;  Laterality: Left;   CATARACT EXTRACTION W/PHACO Right 10/15/2023   Procedure: PHACOEMULSIFICATION, CATARACT, WITH IOL INSERTION;  Surgeon: Juli Blunt, MD;  Location: [facility];  Service: Ophthalmology;  Laterality: Right;  CDE: 8.86   CATARACT EXTRACTION W/PHACO Left 10/29/2023   Procedure: PHACOEMULSIFICATION, CATARACT, WITH IOL INSERTION;  Surgeon: Juli Blunt, MD;  Location: [facility];  Service: Ophthalmology;  Laterality: Left;  CDE: 24.49   COLONOSCOPY WITH PROPOFOL  N/A 08/16/2018   Dr. Harvey: Normal terminal ileum, poor bowel prep, 5 polyps removed measuring 5 to 6 mm.  8 mm polyp in the descending colon removed and site tattooed.  Internal hemorrhoids.  Pathology revealed tubular adenomas.  Next  colonoscopy in 6 to 12 months.   EXPLORATORY LAPAROTOMY     POLYPECTOMY  08/16/2018   Procedure: POLYPECTOMY;  Surgeon: Harvey Margo CROME, MD;  Location: [facility];  Service: Endoscopy;;  colon   SKIN BIOPSY     x 2 -face and chest   TUBAL LIGATION     Social History[1] Social History   Social History Narrative   Not on file     Immunization History  Administered Date(s) Administered   Influenza,inj,Quad PF,6+ Mos 10/20/2013, 11/06/2014, 10/25/2015, 10/23/2016, 10/15/2017, 10/04/2018   Influenza-Unspecified 12/13/2019   PFIZER(Purple Top)SARS-COV-2 Vaccination 05/01/2019   Pneumococcal Polysaccharide-23 02/06/2020   Tdap 10/15/2017   Zoster Recombinant(Shingrix) 10/15/2017, 12/14/2017     Objective: Vital Signs: BP 100/74 (BP Location: Left Arm, Patient Position: Sitting,  Cuff Size: Normal)   Pulse 79   Temp (!) 97.4 F (36.3 C)   Resp 16   Ht 5' 4.5 (1.638 m)   Wt 141 lb 9.6 oz (64.2 kg)   BMI 23.93 kg/m    Physical Exam Vitals and nursing note reviewed.  Constitutional:      Appearance: She is well-developed.  HENT:     Head: Normocephalic and atraumatic.  Eyes:     Conjunctiva/sclera: Conjunctivae normal.  Cardiovascular:     Rate and Rhythm: Normal rate and regular rhythm.     Heart sounds: Normal heart sounds.     Comments: She had weaker radial and brachial pulses on the left side. Pulmonary:     Effort: Pulmonary effort is normal.     Breath sounds: Normal breath sounds.  Abdominal:     General: Bowel sounds are normal.     Palpations: Abdomen is soft.  Musculoskeletal:     Cervical back: Normal range of motion.  Skin:    General: Skin is warm and dry.     Capillary Refill: Capillary refill takes less than 2 seconds.     Comments: Erythematous plaques were noted over her elbows and dorsum of her hands.  Neurological:     Mental Status: She is alert and oriented to person, place, and time.  Psychiatric:        Behavior: Behavior normal.       Musculoskeletal Exam: Cervical, thoracic and lumbar spine were in good range of motion.  There was no SI joint tenderness.  Shoulder joints, elbow joints, wrist joints, MCPs, PIPs and DIPs were in good range of motion with no synovitis.  Hip joints and knee joints were in good range of motion without any warmth swelling or effusion.  Some warmth was noted on the palpation of the right knee.  There was no tenderness over ankles or MTPs.  Hammertoes were noted.   CDAI Exam: CDAI Score: -- Patient Global: 20 / 100; Provider Global: 10 / 100 Swollen: --; Tender: -- Joint Exam 02/23/2024   No joint exam has been documented for this visit   There is currently no information documented on the homunculus. Go to the Rheumatology activity and complete the homunculus joint exam.  Investigation: No additional findings.  Imaging: No results found.  Recent Labs: Lab Results  Component Value Date   WBC 7.5 09/01/2022   HGB 10.8 (L) 09/01/2022   PLT 337.0 09/01/2022   NA 137 09/01/2022   K 4.0 09/01/2022   CL 102 09/01/2022   CO2 27 09/01/2022   GLUCOSE 104 (H) 09/01/2022   BUN 7 09/01/2022   CREATININE 0.77 09/01/2022   BILITOT 0.6 09/01/2022   ALKPHOS 126 (H) 09/01/2022   AST 15 09/01/2022   ALT 11 09/01/2022   PROT 6.8 09/01/2022   ALBUMIN 4.2 09/01/2022   CALCIUM  9.8 09/01/2022   GFRAA 86 11/16/2019   August 18, 2023 WBC 7.1, hemoglobin 9.2, platelets 265, UA negative, CMP creatinine 0.72, GFR greater than 90, ALT 19, AST 13, iron low at 14, hemoglobin A1c 6.2, sed rate 12, CRP<5.0, vitamin D37.3, Jun 23, 2023 LDL 66, HDL 47  Speciality Comments: No specialty comments available.  Procedures:  No procedures performed Allergies: Prednisone , Sulfa  antibiotics, and Suprep [na sulfate-k sulfate-mg sulf]   Assessment / Plan:     Visit Diagnoses: Rheumatoid arthritis involving multiple sites with positive rheumatoid factor (HCC) -patient states she was diagnosed with rheumatoid  arthritis about 5 years ago by  Dr. Fae.  She was treated with methotrexate 5 tablets p.o. weekly along with folic acid  since then.  She states she ran out of medication about a month ago as Dr. Fae left the practice .  He is not accepting her insurance.  She denies any increased joint swelling.  She continues to have some discomfort in her shoulders, hands, right knee.  No synovitis was noted on the examination.  Detailed counseling on rheumatoid arthritis was provided.  As patient has done well on methotrexate I plan to continue methotrexate and folic acid  as prescribed by Dr. Fae.  Plan: Sedimentation rate, Rheumatoid factor, Cyclic citrul peptide antibody, IgG A handout was given and consent was taken.  Patient states she was initially tried on methotrexate tablet and then was switched to subcu methotrexate.  However she would like to go back to oral methotrexate.  Once we have lab results available we will start her on methotrexate 4 tablets p.o. weekly along with folic acid  1 mg p.o. daily.  She will get labs in a month after starting methotrexate and then every 3 months.  Drug Counseling TB Gold: Pending Hepatitis panel: Pending  Chest-xray: 2013  Contraception: None applicable postmenopausal  Alcohol use: None   Patient was counseled on the purpose, proper use, and adverse effects of methotrexate including nausea, infection, and signs and symptoms of pneumonitis.  Reviewed instructions with patient to take methotrexate weekly along with folic acid  daily.  Discussed the importance of frequent monitoring of kidney and liver function and blood counts, and provided patient with standing lab instructions.  Counseled patient to avoid NSAIDs and alcohol while on methotrexate.  Provided patient with educational materials on methotrexate and answered all questions.  Advised patient to get annual influenza vaccine and to get a pneumococcal vaccine if patient has not already had one.  Patient  voiced understanding.  Patient consented to methotrexate use.  Will upload into chart.     High risk medication use - Methotrexate 0.4 mL subcu, folic acid  1 mg p.o. daily - Plan: CBC with Differential/Platelet, Comprehensive metabolic panel with GFR, Hepatitis B core antibody, IgM, Hepatitis B surface antigen, Hepatitis C antibody, QuantiFERON-TB Gold Plus, Serum protein electrophoresis with reflex, IgG, IgA, IgM  Psoriasis-patient had dry scales on her elbows and also on the dorsum of her both hands.  Patient states she has been under care of a dermatologist who diagnosed her with psoriasis.  She was using triamcinolone  topical cream which  ran out.  She wanted a prescription refill on triamcinolone  cream which was sent.  Patient has an appointment coming up with the dermatologist.  Pain in both hands -she complains of stiffness and discomfort in her hands.  No synovitis was noted on the examination today.  Plan: ANA, XR Hand 2 View Right, XR Hand 2 View Left.  X-rays obtained today showed generalized osteopenia and osteoarthritis.  Chronic pain of both knees -she complains of discomfort in her knee joints.  Some warmth was noted in her right knee joint.  Plan: XR KNEE 3 VIEW RIGHT, XR KNEE 3 VIEW LEFT.  X-rays of bilateral knee joints were unremarkable.  Pain in both feet -complains of off-and-on discomfort in her feet.  No synovitis was noted.  Hammertoes were noted.  Plan: XR Foot 2 Views Right, XR Foot 2 Views Left.  X-rays of bilateral feet showed generalized osteopenia and osteoarthritis.  DDD (degenerative disc disease), cervical-she has severe stiffness in her cervical spine.  She had good mobility without any  radiculopathy.  Chronic midline low back pain without sciatica-she could mobility in her lumbar spine or discomfort.  Other fatigue -she gives history of chronic fatigue.  Plan: CK  Myofascial pain-she was diagnosed with myofascial pain syndrome several years ago.  She continues to  have some generalized aches and pains.  Osteoporosis screening-patient states she had recent DEXA scan but she does not know the results and will follow-up with her PCP.  Vitamin D deficiency  Weak arterial pulse -she had blood pressure discrepancy with the pressure of 158/88 in her right arm and 100/74 in her left arm.  Blood pressure readings were repeated twice.  She also had weaker left radial pulse.  I will refer her to vascular surgery for evaluation.  Plan: Ambulatory referral to Vascular Surgery, Pan-ANCA  Other medical problems listed as follows:  Coronary artery disease involving native coronary artery of native heart without angina pectoris  Mixed hyperlipidemia  Esophageal dysphagia  Hiatal hernia  Chronic constipation  Hx of adenomatous colonic polyps  Other iron deficiency anemia-her hemoglobin was low at 9.2 in July 2025.  I advised her to schedule an appointment with her PCP for the evaluation of anemia.  Primary insomnia  History of COPD  Pulmonary nodule  Smoker - 1 pack/day, previously 2 packs/day since age 70.  Smoking cessation was discussed.  Association of smoking with rheumatoid arthritis was discussed.  Patient states she gets chest CT scan to evaluate for lung cancer.  Orders: Orders Placed This Encounter  Procedures   XR Hand 2 View Right   XR Hand 2 View Left   XR KNEE 3 VIEW RIGHT   XR KNEE 3 VIEW LEFT   XR Foot 2 Views Right   XR Foot 2 Views Left   CBC with Differential/Platelet   Comprehensive metabolic panel with GFR   Sedimentation rate   CK   Rheumatoid factor   Cyclic citrul peptide antibody, IgG   ANA   Hepatitis B core antibody, IgM   Hepatitis B surface antigen   Hepatitis C antibody   QuantiFERON-TB Gold Plus   Serum protein electrophoresis with reflex   IgG, IgA, IgM   Pan-ANCA   Ambulatory referral to Vascular Surgery   Meds ordered this encounter  Medications   triamcinolone  cream (KENALOG ) 0.5 %    Sig: Apply 1  Application topically 3 (three) times daily.    Dispense:  30 g    Refill:  0    Face-to-face time spent with patient was 60 minutes. Greater than 50% of time was spent in counseling and coordination of care.  Follow-Up Instructions: Return for Rheumatoid arthritis.   Maya Nash, MD  Note - This record has been created using Animal nutritionist.  Chart creation errors have been sought, but may not always  have been located. Such creation errors do not reflect on  the standard of medical care.     [1]  Social History Tobacco Use   Smoking status: Every Day    Current packs/day: 1.00    Types: Cigarettes    Passive exposure: Never   Smokeless tobacco: Never  Vaping Use   Vaping status: Never Used  Substance Use Topics   Alcohol use: Not Currently   Drug use: Yes    Types: Marijuana    Comment: once in a blue moon   "

## 2024-02-23 ENCOUNTER — Encounter: Payer: Self-pay | Admitting: Rheumatology

## 2024-02-23 ENCOUNTER — Other Ambulatory Visit: Payer: Self-pay

## 2024-02-23 ENCOUNTER — Ambulatory Visit

## 2024-02-23 ENCOUNTER — Ambulatory Visit: Attending: Rheumatology | Admitting: Rheumatology

## 2024-02-23 VITALS — BP 100/74 | HR 79 | Temp 97.4°F | Resp 16 | Ht 64.5 in | Wt 141.6 lb

## 2024-02-23 DIAGNOSIS — Z8709 Personal history of other diseases of the respiratory system: Secondary | ICD-10-CM

## 2024-02-23 DIAGNOSIS — M79642 Pain in left hand: Secondary | ICD-10-CM

## 2024-02-23 DIAGNOSIS — M79671 Pain in right foot: Secondary | ICD-10-CM

## 2024-02-23 DIAGNOSIS — L409 Psoriasis, unspecified: Secondary | ICD-10-CM

## 2024-02-23 DIAGNOSIS — R0989 Other specified symptoms and signs involving the circulatory and respiratory systems: Secondary | ICD-10-CM

## 2024-02-23 DIAGNOSIS — Z860101 Personal history of adenomatous and serrated colon polyps: Secondary | ICD-10-CM

## 2024-02-23 DIAGNOSIS — M5030 Other cervical disc degeneration, unspecified cervical region: Secondary | ICD-10-CM

## 2024-02-23 DIAGNOSIS — M25561 Pain in right knee: Secondary | ICD-10-CM

## 2024-02-23 DIAGNOSIS — Z79899 Other long term (current) drug therapy: Secondary | ICD-10-CM | POA: Diagnosis not present

## 2024-02-23 DIAGNOSIS — M545 Low back pain, unspecified: Secondary | ICD-10-CM | POA: Diagnosis not present

## 2024-02-23 DIAGNOSIS — G8929 Other chronic pain: Secondary | ICD-10-CM

## 2024-02-23 DIAGNOSIS — Z13820 Encounter for screening for osteoporosis: Secondary | ICD-10-CM | POA: Diagnosis not present

## 2024-02-23 DIAGNOSIS — F5101 Primary insomnia: Secondary | ICD-10-CM

## 2024-02-23 DIAGNOSIS — M79641 Pain in right hand: Secondary | ICD-10-CM

## 2024-02-23 DIAGNOSIS — M2550 Pain in unspecified joint: Secondary | ICD-10-CM

## 2024-02-23 DIAGNOSIS — R7989 Other specified abnormal findings of blood chemistry: Secondary | ICD-10-CM

## 2024-02-23 DIAGNOSIS — E559 Vitamin D deficiency, unspecified: Secondary | ICD-10-CM

## 2024-02-23 DIAGNOSIS — M79672 Pain in left foot: Secondary | ICD-10-CM

## 2024-02-23 DIAGNOSIS — M25562 Pain in left knee: Secondary | ICD-10-CM

## 2024-02-23 DIAGNOSIS — M7918 Myalgia, other site: Secondary | ICD-10-CM | POA: Diagnosis not present

## 2024-02-23 DIAGNOSIS — I2510 Atherosclerotic heart disease of native coronary artery without angina pectoris: Secondary | ICD-10-CM

## 2024-02-23 DIAGNOSIS — K5909 Other constipation: Secondary | ICD-10-CM

## 2024-02-23 DIAGNOSIS — F17200 Nicotine dependence, unspecified, uncomplicated: Secondary | ICD-10-CM

## 2024-02-23 DIAGNOSIS — E782 Mixed hyperlipidemia: Secondary | ICD-10-CM

## 2024-02-23 DIAGNOSIS — R1319 Other dysphagia: Secondary | ICD-10-CM

## 2024-02-23 DIAGNOSIS — M0579 Rheumatoid arthritis with rheumatoid factor of multiple sites without organ or systems involvement: Secondary | ICD-10-CM | POA: Diagnosis not present

## 2024-02-23 DIAGNOSIS — R5383 Other fatigue: Secondary | ICD-10-CM

## 2024-02-23 DIAGNOSIS — R911 Solitary pulmonary nodule: Secondary | ICD-10-CM

## 2024-02-23 DIAGNOSIS — D508 Other iron deficiency anemias: Secondary | ICD-10-CM

## 2024-02-23 DIAGNOSIS — K449 Diaphragmatic hernia without obstruction or gangrene: Secondary | ICD-10-CM

## 2024-02-23 MED ORDER — TRIAMCINOLONE ACETONIDE 0.5 % EX CREA: 1.0000 | TOPICAL_CREAM | Freq: Three times a day (TID) | CUTANEOUS | 0 refills | Status: AC

## 2024-02-23 NOTE — Patient Instructions (Addendum)
 Standing Labs We placed an order today for your standing lab work.   Please have your standing labs drawn in 1 month after starting methotrexate and then every 3 months  Please have your labs drawn 2 weeks prior to your appointment so that the provider can discuss your lab results at your appointment, if possible.  Please note that you may see your imaging and lab results in MyChart before we have reviewed them. We will contact you once all results are reviewed. Please allow our office up to 72 hours to thoroughly review all of the results before contacting the office for clarification of your results.  WALK-IN LAB HOURS  Monday through Thursday from 8:00 am - 4:30 pm and Friday from 8:00 am-12:00 pm.  Patients with office visits requiring labs will be seen before walk-in labs.  You may encounter longer than normal wait times. Please allow additional time. Wait times may be shorter on  Monday and Thursday afternoons.  We do not book appointments for walk-in labs. We appreciate your patience and understanding with our staff.   Labs are drawn by [facility]. Please bring your co-pay at the time of your lab draw.  You may receive a bill from [facility] for your lab work.  Please note if you are on Hydroxychloroquine and and an order has been placed for a Hydroxychloroquine level,  you will need to have it drawn 4 hours or more after your last dose.  If you wish to have your labs drawn at another location, please call the office 24 hours in advance so we can fax the orders.  The office is located at [address], [place], [place] [address]   If you have any questions regarding directions or hours of operation,  please call [phone].   As a reminder, please drink plenty of water  prior to coming for your lab work. Thanks!   Vaccines You are taking a medication(s) that can suppress your immune system.  The following immunizations are recommended: Flu annually Covid-19  Td/Tdap  (tetanus, diphtheria, pertussis) every 10 years Pneumonia (Prevnar 15 then Pneumovax 23 at least 1 year apart.  Alternatively, can take Prevnar 20 without needing additional dose) Shingrix: 2 doses from 4 weeks to 6 months apart  Please check with your PCP to make sure you are up to date.  If you have signs or symptoms of an infection or start antibiotics: First, call your PCP for workup of your infection. Hold your medication through the infection, until you complete your antibiotics, and until symptoms resolve if you take the following: Injectable medication (Actemra, Benlysta, Cimzia, Cosentyx, Enbrel, Humira, Kevzara, Orencia, Remicade, Simponi, Stelara, Taltz, Tremfya) Methotrexate Leflunomide (Arava) Mycophenolate (Cellcept) Earma, Olumiant, or Rinvoq  Methotrexate Tablets What is this medication? METHOTREXATE (METH oh TREX ate) treats autoimmune conditions, such as arthritis and psoriasis. It works by decreasing inflammation, which can reduce pain and prevent long-term injury to the joints and skin. It may also be used to treat some types of cancer. It works by slowing down the growth of cancer cells. This medicine may be used for other purposes; ask your health care provider or pharmacist if you have questions. COMMON BRAND NAME(S): Rheumatrex, Trexall What should I tell my care team before I take this medication? They need to know if you have any of these conditions: Dehydration Diabetes Fluid in the stomach area or lungs Frequently drink alcohol Having surgery, including dental surgery High cholesterol Immune system problems Inflammatory bowel disease, such as  ulcerative colitis Kidney disease Liver disease Low blood cell levels (white cells, red cells, and platelets) Lung disease Recent or ongoing radiation Recent or upcoming vaccine Stomach ulcers, other stomach or intestine problems An unusual or allergic reaction to methotrexate, other medications, foods, dyes, or  preservatives Pregnant or trying to get pregnant Breastfeeding How should I use this medication? Take this medication by mouth with water . Take it as directed on the prescription label. Do not take extra. Keep taking this medication until your care team tells you to stop. Know why you are taking this medication and how you should take it. To treat conditions such as arthritis and psoriasis, this medication is taken ONCE A WEEK as a single dose or divided into 3 smaller doses taken 12 hours apart (do not take more than 3 doses 12 hours apart each week). This medication is NEVER taken daily to treat conditions other than cancer. Taking this medication more often than directed can cause serious side effects, even death. Talk to your care team about why you are taking this medication, how often you will take it, and what your dose is. Ask your care team to put the reason you take this medication on the prescription. If you take this medication ONCE A WEEK, choose a day of the week before you start. Ask your pharmacist to include the day of the week on the label. Avoid Monday, which could be misread as Morning. Handling this medication may be harmful. Talk to your care team about how to handle this medication. Special instructions may apply. Talk to your care team about the use of this medication in children. While it may be prescribed for selected conditions, precautions do apply. Overdosage: If you think you have taken too much of this medicine contact a poison control center or emergency room at once. NOTE: This medicine is only for you. Do not share this medicine with others. What if I miss a dose? If you miss a dose, talk with your care team. Do not take double or extra doses. What may interact with this medication? Do not take this medication with any of the following: Acitretin Live virus vaccines Probenecid This medication may also interact with the following: Alcohol Aspirin  and  aspirin -like medications Certain antibiotics, such as penicillin, neomycin, sulfamethoxazole ; trimethoprim  Certain medications for stomach problems, such as lansoprazole, omeprazole , pantoprazole  Clozapine Cyclosporine Dapsone Folic acid  Foscarnet NSAIDs, medications for pain and inflammation, such as ibuprofen  or naproxen Phenytoin Pyrimethamine Steroid medications, such as prednisone  or cortisone Tacrolimus Theophylline This list may not describe all possible interactions. Give your health care provider a list of all the medicines, herbs, non-prescription drugs, or dietary supplements you use. Also tell them if you smoke, drink alcohol, or use illegal drugs. Some items may interact with your medicine. What should I watch for while using this medication? Visit your care team for regular checks on your progress. It may be some time before you see the benefit from this medication. You may need blood work done while you are taking this medication. If your care team has also prescribed folic acid , they may instruct you to skip your folic acid  dose on the day you take methotrexate. This medication can make you more sensitive to the sun. Keep out of the sun. If you cannot avoid being in the sun, wear protective clothing and sunscreen. Do not use sun lamps, tanning beds, or tanning booths. Check with your care team if you have severe diarrhea, nausea, and vomiting, or if  you sweat a lot. The loss of too much body fluid may make it dangerous for you to take this medication. This medication may increase your risk of getting an infection. Call your care team for advice if you get a fever, chills, sore throat, or other symptoms of a cold or flu. Do not treat yourself. Try to avoid being around people who are sick. Talk to your care team about your risk of cancer. You may be more at risk for certain types of cancers if you take this medication. Talk to your care team if you or your partner may be  pregnant. Serious birth defects can occur if you take this medication during pregnancy and for 6 months after the last dose. You will need a negative pregnancy test before starting this medication. Contraception is recommended while taking this medication and for 6 months after the last dose. Your care team can help you find the option that works for you. If your partner can get pregnant, use a condom during sex while taking this medication and for 3 months after the last dose. Do not breastfeed while taking this medication and for 1 week after the last dose. This medication may cause infertility. Talk to your care team if you are concerned about your fertility. What side effects may I notice from receiving this medication? Side effects that you should report to your care team as soon as possible: Allergic reactions--skin rash, itching, hives, swelling of the face, lips, tongue, or throat Dry cough, shortness of breath or trouble breathing Infection--fever, chills, cough, sore throat, wounds that don't heal, pain or trouble when passing urine, general feeling of discomfort or being unwell Kidney injury--decrease in the amount of urine, swelling of the ankles, hands, or feet Liver injury--right upper belly pain, loss of appetite, nausea, light-colored stool, dark yellow or brown urine, yellowing skin or eyes, unusual weakness or fatigue Low red blood cell level--unusual weakness or fatigue, dizziness, headache, trouble breathing Pain, tingling, or numbness in the hands or feet, muscle weakness, change in vision, confusion or trouble speaking, loss of balance or coordination, trouble walking, seizures Redness, blistering, peeling, or loosening of the skin, including inside the mouth Stomach bleeding--bloody or black, tar-like stools, vomiting blood or brown material that looks like coffee grounds Stomach pain that is severe, does not go away, or gets worse Unusual bruising or bleeding Side effects  that usually do not require medical attention (report these to your care team if they continue or are bothersome): Diarrhea Dizziness Hair loss Nausea Pain, redness, or swelling with sores inside the mouth or throat Skin reactions on sun-exposed areas Vomiting This list may not describe all possible side effects. Call your doctor for medical advice about side effects. You may report side effects to FDA at 1-800-FDA-1088. Where should I keep my medication? Keep out of the reach of children and pets. Store at room temperature between 20 and 25 degrees C (68 and 77 degrees F). Protect from light. Keep the container tightly closed. Get rid of any unused medication after the expiration date. To get rid of medications that are no longer needed or have expired: Take the medication to a medication take-back program. Check with your pharmacy or law enforcement to find a location. If you cannot return the medication, ask your pharmacist or care team how to get rid of this medication safely. NOTE: This sheet is a summary. It may not cover all possible information. If you have questions about this medicine, talk to  your doctor, pharmacist, or health care provider.  2024 Elsevier/Gold Standard (2023-01-01 00:00:00)Rheumatoid Arthritis Rheumatoid arthritis (RA) is a long-term (chronic) disease. RA causes inflammation in your joints. Your joints may feel painful, stiff, swollen, and warm. RA may start slowly. It most often affects the small joints of the hands and feet. It can also affect other parts of the body. Symptoms of RA often come and go. There is no cure for RA, but medicines can help your symptoms. What are the causes? RA is an autoimmune disease. This means that your body's defense system (immune system) attacks healthy parts of your body by mistake. The exact cause of RA is not known. What increases the risk? Being female. Having a family history of RA or other diseases like RA. Smoking. Being  very overweight (obese). Being exposed to pollutants or chemicals. What are the signs or symptoms? Symptoms start slowly. They are often worse in the morning. The first symptom is often morning stiffness that lasts longer than 30 minutes. As RA gets worse, symptoms may include: Pain, stiffness, swelling, warmth, and tenderness in joints on both sides of your body. Loss of energy. Not wanting to eat as much as normal. Weight loss. A low fever. Dry eyes and a dry mouth. Firm lumps that grow under your skin. Changes in the way your joints look or the way they work. Symptoms vary and they often come and go. Symptoms sometimes get worse for a period of time. These are called flares. How is this treated? Treatment may include: Taking good care of yourself. Be sure to rest as needed, eat a healthy diet, and exercise. Medicines. These may include: Pain relievers. Medicines to help with inflammation. Disease-modifying antirheumatic drugs (DMARDs). Medicines called biologic response modifiers. Physical therapy and occupational therapy. Surgery, if joint damage is very bad. Your doctor will work with you to find the best treatments. Follow these instructions at home: Managing pain, stiffness, and swelling If told, put heat on the affected area. Do this as often as told by your doctor. Use the heat source that your doctor recommends, such as a moist heat pack or a heating pad. Place a towel between your skin and the heat source. Leave the heat on for 20-30 minutes. Take off the heat if your skin turns bright red. This is very important. If you cannot feel pain, heat, or cold, you have a greater risk of getting burned.  Activity Return to your normal activities when your doctor says that it is safe. Rest when you have a flare. Exercise as told by your doctor. This can help your joints move better and get stronger. General instructions Take over-the-counter and prescription medicines only as  told by your doctor. Keep all follow-up visits. Where to find more information [employer]: rheumatology.org Arthritis Foundation: arthritis.org Contact a doctor if: You have a flare. You have a fever. You have problems because of your medicines. Get help right away if: You have chest pain. You have trouble breathing. You get a hot, painful joint all of a sudden, and it is worse than your normal joint aches. These symptoms may be an emergency. Get help right away. Call 911. Do not wait to see if the symptoms will go away. Do not drive yourself to the hospital. Summary RA is a long-term disease. RA causes inflammation in your joints. Symptoms of RA start slowly. They are often worse in the morning. This information is not intended to replace advice given to you by  your health care provider. Make sure you discuss any questions you have with your health care provider. Document Revised: 11/21/2020 Document Reviewed: 11/21/2020 Elsevier Patient Education  2024 [employer].

## 2024-02-23 NOTE — Telephone Encounter (Signed)
 Pending lab results, we will refill methotrexate per Dr. Dolphus.   Consent obtained and sent to the scan center.

## 2024-02-24 ENCOUNTER — Ambulatory Visit: Payer: Self-pay | Admitting: Rheumatology

## 2024-02-24 NOTE — Telephone Encounter (Signed)
 Lauren Harrison was able to add the Alkaline Phosphatase isoenzymes to patients labs

## 2024-02-24 NOTE — Progress Notes (Signed)
 Please add alkaline phosphatase isoenzymes if possible

## 2024-02-25 NOTE — Progress Notes (Signed)
 Hemoglobin is low, alkaline phosphatase is elevated, further testing of alkaline phosphatase is pending.  Rheumatoid factor is positive, anti-CCP negative, ANA negative, sed rate normal, CK normal, hepatitis B&C nonreactive, TB Gold negative, immunoglobulins normal, pan ANCA and SPEP pending.

## 2024-02-29 NOTE — Progress Notes (Signed)
 IFE is normal.  Alkaline phosphatase isoenzymes and pan ANCA still pending.

## 2024-03-02 LAB — CBC WITH DIFFERENTIAL/PLATELET
Absolute Lymphocytes: 2431 {cells}/uL (ref 850–3900)
Absolute Monocytes: 536 {cells}/uL (ref 200–950)
Basophils Absolute: 60 {cells}/uL (ref 0–200)
Basophils Relative: 0.7 %
Eosinophils Absolute: 128 {cells}/uL (ref 15–500)
Eosinophils Relative: 1.5 %
HCT: 33.4 % — ABNORMAL LOW (ref 35.9–46.0)
Hemoglobin: 10.2 g/dL — ABNORMAL LOW (ref 11.7–15.5)
MCH: 24.1 pg — ABNORMAL LOW (ref 27.0–33.0)
MCHC: 30.5 g/dL — ABNORMAL LOW (ref 31.6–35.4)
MCV: 78.8 fL — ABNORMAL LOW (ref 81.4–101.7)
MPV: 9.9 fL (ref 7.5–12.5)
Monocytes Relative: 6.3 %
Neutro Abs: 5347 {cells}/uL (ref 1500–7800)
Neutrophils Relative %: 62.9 %
Platelets: 420 10*3/uL — ABNORMAL HIGH (ref 140–400)
RBC: 4.24 Million/uL (ref 3.80–5.10)
RDW: 16.7 % — ABNORMAL HIGH (ref 11.0–15.0)
Total Lymphocyte: 28.6 %
WBC: 8.5 10*3/uL (ref 3.8–10.8)

## 2024-03-02 LAB — COMPREHENSIVE METABOLIC PANEL WITH GFR
AG Ratio: 1.7 (calc) (ref 1.0–2.5)
ALT: 28 U/L (ref 6–29)
AST: 28 U/L (ref 10–35)
Albumin: 4.5 g/dL (ref 3.6–5.1)
Alkaline phosphatase (APISO): 164 U/L — ABNORMAL HIGH (ref 37–153)
BUN: 10 mg/dL (ref 7–25)
CO2: 24 mmol/L (ref 20–32)
Calcium: 10 mg/dL (ref 8.6–10.4)
Chloride: 102 mmol/L (ref 98–110)
Creat: 0.79 mg/dL (ref 0.50–1.05)
Globulin: 2.6 g/dL (ref 1.9–3.7)
Glucose, Bld: 109 mg/dL — ABNORMAL HIGH (ref 65–99)
Potassium: 4.1 mmol/L (ref 3.5–5.3)
Sodium: 135 mmol/L (ref 135–146)
Total Bilirubin: 0.5 mg/dL (ref 0.2–1.2)
Total Protein: 7.1 g/dL (ref 6.1–8.1)
eGFR: 81 mL/min/{1.73_m2}

## 2024-03-02 LAB — PROTEIN ELECTROPHORESIS, SERUM, WITH REFLEX
Albumin ELP: 4 g/dL (ref 3.8–4.8)
Alpha 1: 0.4 g/dL — ABNORMAL HIGH (ref 0.2–0.3)
Alpha 2: 0.9 g/dL (ref 0.5–0.9)
Beta 2: 0.5 g/dL (ref 0.2–0.5)
Beta Globulin: 0.6 g/dL (ref 0.4–0.6)
Gamma Globulin: 0.7 g/dL — ABNORMAL LOW (ref 0.8–1.7)
Total Protein: 7 g/dL (ref 6.1–8.1)

## 2024-03-02 LAB — QUANTIFERON-TB GOLD PLUS
Mitogen-NIL: >10 [IU]/mL
NIL: 0.02 [IU]/mL
QuantiFERON-TB Gold Plus: NEGATIVE
TB1-NIL: 0.02 [IU]/mL
TB2-NIL: 0.02 [IU]/mL

## 2024-03-02 LAB — SEDIMENTATION RATE: Sed Rate: 28 mm/h (ref 0–30)

## 2024-03-02 LAB — HEPATITIS B CORE ANTIBODY, IGM: Hep B C IgM: NONREACTIVE

## 2024-03-02 LAB — CK: Total CK: 86 U/L (ref 20–243)

## 2024-03-02 LAB — HEPATITIS C ANTIBODY: Hepatitis C Ab: NONREACTIVE

## 2024-03-02 LAB — IGG, IGA, IGM
IgG (Immunoglobin G), Serum: 759 mg/dL (ref 600–1540)
IgM, Serum: 98 mg/dL (ref 50–300)
Immunoglobulin A: 305 mg/dL (ref 70–320)

## 2024-03-02 LAB — ALKALINE PHOSPHATASE ISOENZYMES
Alkaline phosphatase (APISO): 161 U/L — ABNORMAL HIGH (ref 37–153)
Bone Isoenzymes: 40 % (ref 28–66)
Intestinal Isoenzymes: 0 % — ABNORMAL LOW (ref 1–24)
Liver Isoenzymes: 60 % (ref 25–69)
Macrohepatic isoenzymes: 0 %
Placental isoenzymes: 0 %

## 2024-03-02 LAB — CYCLIC CITRUL PEPTIDE ANTIBODY, IGG: Cyclic Citrullin Peptide Ab: <16 U

## 2024-03-02 LAB — TEST AUTHORIZATION

## 2024-03-02 LAB — RHEUMATOID FACTOR: Rheumatoid fact SerPl-aCnc: 38 [IU]/mL — ABNORMAL HIGH

## 2024-03-02 LAB — IFE INTERPRETATION

## 2024-03-02 LAB — PAN-ANCA
Myeloperoxidase Abs: <1
Serine Protease 3: <1 AI
Serine Protease 3: <1 AI

## 2024-03-02 LAB — ANA: Anti Nuclear Antibody (ANA): NEGATIVE

## 2024-03-02 LAB — HEPATITIS B SURFACE ANTIGEN: Hepatitis B Surface Ag: NONREACTIVE

## 2024-03-06 NOTE — Addendum Note (Signed)
 Addended by: CENA ALFONSO CROME on: 03/06/2024 03:25 PM   Modules accepted: Orders

## 2024-03-06 NOTE — Telephone Encounter (Signed)
 At the last visit patient requested switching to methotrexate  tablets.  Her dose will be methotrexate  2.5 mg tablet, 4 tablets p.o. weekly.  90-day supply with 0 refills

## 2024-03-07 MED ORDER — METHOTREXATE SODIUM 2.5 MG PO TABS: 10.0000 mg | ORAL_TABLET | ORAL | 0 refills | Status: AC

## 2024-03-07 MED ORDER — FOLIC ACID 1 MG PO TABS: 1.0000 mg | ORAL_TABLET | Freq: Every day | ORAL | 3 refills | Status: AC

## 2024-03-07 NOTE — Telephone Encounter (Signed)
 Please review and sign pended methotrexate  tablet prescription and folic acid . Thanks!

## 2024-03-15 ENCOUNTER — Encounter: Admitting: Rheumatology

## 2024-04-10 ENCOUNTER — Encounter: Admitting: Surgery

## 2024-04-10 ENCOUNTER — Ambulatory Visit (HOSPITAL_COMMUNITY)

## 2024-04-11 ENCOUNTER — Ambulatory Visit: Admitting: Rheumatology
